# Patient Record
Sex: Male | Born: 1980 | Race: White | Hispanic: No | Marital: Single | Smoking: Current some day smoker
Health system: Southern US, Community
[De-identification: ages and names within clinical notes are randomized; demographics above are authoritative.]

## PROBLEM LIST (undated history)

## (undated) DIAGNOSIS — Z765 Malingerer [conscious simulation]: Secondary | ICD-10-CM

## (undated) DIAGNOSIS — F419 Anxiety disorder, unspecified: Secondary | ICD-10-CM

## (undated) DIAGNOSIS — F1010 Alcohol abuse, uncomplicated: Secondary | ICD-10-CM

## (undated) DIAGNOSIS — G8929 Other chronic pain: Secondary | ICD-10-CM

---

## 2019-06-24 ENCOUNTER — Encounter (HOSPITAL_BASED_OUTPATIENT_CLINIC_OR_DEPARTMENT_OTHER): Payer: Self-pay

## 2019-06-24 ENCOUNTER — Emergency Department (HOSPITAL_BASED_OUTPATIENT_CLINIC_OR_DEPARTMENT_OTHER): Payer: Self-pay

## 2019-06-24 ENCOUNTER — Inpatient Hospital Stay (HOSPITAL_BASED_OUTPATIENT_CLINIC_OR_DEPARTMENT_OTHER)
Admission: EM | Admit: 2019-06-24 | Discharge: 2019-06-26 | DRG: 897 | Disposition: A | Discharge status: Home / Self Care | Payer: Self-pay | Attending: Internal Medicine | Admitting: Internal Medicine

## 2019-06-24 ENCOUNTER — Other Ambulatory Visit: Payer: Self-pay

## 2019-06-24 DIAGNOSIS — Z6834 Body mass index (BMI) 34.0-34.9, adult: Secondary | ICD-10-CM

## 2019-06-24 DIAGNOSIS — R000 Tachycardia, unspecified: Secondary | ICD-10-CM

## 2019-06-24 DIAGNOSIS — E669 Obesity, unspecified: Secondary | ICD-10-CM | POA: Diagnosis present

## 2019-06-24 DIAGNOSIS — Z811 Family history of alcohol abuse and dependence: Secondary | ICD-10-CM

## 2019-06-24 DIAGNOSIS — R739 Hyperglycemia, unspecified: Secondary | ICD-10-CM | POA: Diagnosis present

## 2019-06-24 DIAGNOSIS — Z20828 Contact with and (suspected) exposure to other viral communicable diseases: Secondary | ICD-10-CM | POA: Diagnosis present

## 2019-06-24 DIAGNOSIS — D72829 Elevated white blood cell count, unspecified: Secondary | ICD-10-CM | POA: Diagnosis present

## 2019-06-24 DIAGNOSIS — F10939 Alcohol use, unspecified with withdrawal, unspecified: Secondary | ICD-10-CM | POA: Diagnosis present

## 2019-06-24 DIAGNOSIS — Y908 Blood alcohol level of 240 mg/100 ml or more: Secondary | ICD-10-CM | POA: Diagnosis present

## 2019-06-24 DIAGNOSIS — F10239 Alcohol dependence with withdrawal, unspecified: Secondary | ICD-10-CM | POA: Diagnosis present

## 2019-06-24 DIAGNOSIS — F10932 Alcohol use, unspecified with withdrawal with perceptual disturbance: Secondary | ICD-10-CM

## 2019-06-24 DIAGNOSIS — F10232 Alcohol dependence with withdrawal with perceptual disturbance: Principal | ICD-10-CM | POA: Diagnosis present

## 2019-06-24 DIAGNOSIS — K709 Alcoholic liver disease, unspecified: Secondary | ICD-10-CM | POA: Diagnosis present

## 2019-06-24 LAB — COMPREHENSIVE METABOLIC PANEL
ALT: 43 U/L (ref 0–44)
AST: 37 U/L (ref 15–41)
Albumin: 4 g/dL (ref 3.5–5.0)
Alkaline Phosphatase: 58 U/L (ref 38–126)
Anion gap: 13 (ref 5–15)
BUN: 11 mg/dL (ref 6–20)
CO2: 25 mmol/L (ref 22–32)
Calcium: 8.4 mg/dL — ABNORMAL LOW (ref 8.9–10.3)
Chloride: 102 mmol/L (ref 98–111)
Creatinine, Ser: 0.91 mg/dL (ref 0.61–1.24)
GFR calc Af Amer: >60 mL/min (ref >60)
GFR calc non Af Amer: >60 mL/min (ref >60)
Glucose, Bld: 150 mg/dL — ABNORMAL HIGH (ref 70–99)
Potassium: 3.9 mmol/L (ref 3.5–5.1)
Sodium: 140 mmol/L (ref 135–145)
Total Bilirubin: 1.5 mg/dL — ABNORMAL HIGH (ref 0.3–1.2)
Total Protein: 7.1 g/dL (ref 6.5–8.1)

## 2019-06-24 LAB — CBC
HCT: 44.5 % (ref 39.0–52.0)
Hemoglobin: 15.5 g/dL (ref 13.0–17.0)
MCH: 30.3 pg (ref 26.0–34.0)
MCHC: 34.8 g/dL (ref 30.0–36.0)
MCV: 86.9 fL (ref 80.0–100.0)
Platelets: 324 10*3/uL (ref 150–400)
RBC: 5.12 MIL/uL (ref 4.22–5.81)
RDW: 11.9 % (ref 11.5–15.5)
WBC: 11.2 10*3/uL — ABNORMAL HIGH (ref 4.0–10.5)
nRBC: 0 % (ref 0.0–0.2)

## 2019-06-24 LAB — SARS CORONAVIRUS 2 AG (30 MIN TAT): SARS Coronavirus 2 Ag: NEGATIVE

## 2019-06-24 LAB — ETHANOL: Alcohol, Ethyl (B): 311 mg/dL (ref <10)

## 2019-06-24 MED ORDER — LORAZEPAM 1 MG PO TABS: 0.0000 mg | ORAL_TABLET | Freq: Two times a day (BID) | ORAL | Status: DC

## 2019-06-24 MED ORDER — THIAMINE HCL 100 MG/ML IJ SOLN
100.0000 mg | Freq: Every day | INTRAMUSCULAR | Status: DC
  Administered 2019-06-24: 15:00:00 100 mg via INTRAVENOUS
  Filled 2019-06-24: qty 2

## 2019-06-24 MED ORDER — LORAZEPAM 2 MG/ML IJ SOLN: 0.0000 mg | Freq: Two times a day (BID) | INTRAMUSCULAR | Status: DC

## 2019-06-24 MED ORDER — LORAZEPAM 2 MG/ML IJ SOLN
2.0000 mg | Freq: Once | INTRAMUSCULAR | Status: AC
  Administered 2019-06-24: 18:00:00 2 mg via INTRAVENOUS
  Filled 2019-06-24: qty 1

## 2019-06-24 MED ORDER — SODIUM CHLORIDE 0.9 % IV BOLUS
1000.0000 mL | Freq: Once | INTRAVENOUS | Status: AC
  Administered 2019-06-24: 1000 mL via INTRAVENOUS

## 2019-06-24 MED ORDER — THIAMINE HCL 100 MG PO TABS
100.0000 mg | ORAL_TABLET | Freq: Every day | ORAL | Status: DC
  Administered 2019-06-25 – 2019-06-26 (×2): 100 mg via ORAL
  Filled 2019-06-24 (×2): qty 1

## 2019-06-24 MED ORDER — LORAZEPAM 2 MG/ML IJ SOLN
0.0000 mg | Freq: Four times a day (QID) | INTRAMUSCULAR | Status: DC
  Administered 2019-06-24 – 2019-06-25 (×3): 1 mg via INTRAVENOUS
  Filled 2019-06-24 (×3): qty 1

## 2019-06-24 MED ORDER — LORAZEPAM 1 MG PO TABS
0.0000 mg | ORAL_TABLET | Freq: Four times a day (QID) | ORAL | Status: DC
  Administered 2019-06-25: 1 mg via ORAL
  Filled 2019-06-24: qty 1

## 2019-06-24 MED ORDER — ONDANSETRON HCL 4 MG/2ML IJ SOLN
4.0000 mg | Freq: Once | INTRAMUSCULAR | Status: AC
  Administered 2019-06-24: 4 mg via INTRAVENOUS
  Filled 2019-06-24: qty 2

## 2019-06-24 NOTE — ED Notes (Signed)
Patient stated that he drinks to prevent shakes.  He came here to help him quit.

## 2019-06-24 NOTE — ED Notes (Signed)
ED Provider at bedside. 

## 2019-06-24 NOTE — ED Triage Notes (Signed)
Pt arrives to ED stating that he is trying to detox from ETOH. Last drink "a couple of hours ago". Pt states that he has been drinking for a couple days.

## 2019-06-24 NOTE — ED Provider Notes (Addendum)
[facility] Emergency Department Provider Note MRN:  [id number]  Arrival date & time: 06/24/19     Chief Complaint   Alcohol Problem   History of Present Illness   Leonard Atkinson is a 38 y.o. year-old male with no pertinent past medical history presenting to the ED with chief complaint of alcohol problem.  Patient has been having issues with alcohol for the past 2 years.  He does not drink every day but he tends to go on "benders".  He will binge drink for several days at a time.  He has been drinking heavily for the past 2 days.  He drank this morning to prevent the shakes.  Currently feeling generally unwell but denies pain, denies any trauma.  Endorsing nausea but no vomiting.  No diarrhea.  No fever, no cough.  No chest pain or shortness of breath.  Explains that he wants help quitting because his drinking has been "ruining his life".  Denies any significant depression, no suicidal ideation.  Review of Systems  A complete 10 system review of systems was obtained and all systems are negative except as noted in the HPI and PMH.   Patient's Health History   History reviewed. No pertinent past medical history.  History reviewed. No pertinent surgical history.  No family history on file.  Social History   Socioeconomic History  . Marital status: Single    Spouse name: Not on file  . Number of children: Not on file  . Years of education: Not on file  . Highest education level: Not on file  Occupational History  . Not on file  Tobacco Use  . Smoking status: Never Smoker  . Smokeless tobacco: Never Used  Substance and Sexual Activity  . Alcohol use: Yes    Comment: pt rpeorts 24 beers in the last 24 hours   . Drug use: Never  . Sexual activity: Never  Other Topics Concern  . Not on file  Social History Narrative  . Not on file   Social Determinants of Health   Financial Resource Strain:   . Difficulty of Paying Living Expenses: Not on file    Food Insecurity:   . Worried About Charity fundraiser in the Last Year: Not on file  . Ran Out of Food in the Last Year: Not on file  Transportation Needs:   . Lack of Transportation (Medical): Not on file  . Lack of Transportation (Non-Medical): Not on file  Physical Activity:   . Days of Exercise per Week: Not on file  . Minutes of Exercise per Session: Not on file  Stress:   . Feeling of Stress : Not on file  Social Connections:   . Frequency of Communication with Friends and Family: Not on file  . Frequency of Social Gatherings with Friends and Family: Not on file  . Attends Religious Services: Not on file  . Active Member of Clubs or Organizations: Not on file  . Attends Archivist Meetings: Not on file  . Marital Status: Not on file  Intimate Partner Violence:   . Fear of Current or Ex-Partner: Not on file  . Emotionally Abused: Not on file  . Physically Abused: Not on file  . Sexually Abused: Not on file     Physical Exam  Vital Signs and Nursing Notes reviewed Vitals:   06/24/19 1156 06/24/19 1445  BP: (!) 132/99 (!) 125/105  Pulse: (!) 114 92  Resp: 20   Temp: 98.6  F (37 C)   SpO2: 97%     CONSTITUTIONAL: Well-appearing, NAD NEURO:  Alert and oriented x 3, slow to answer questions, no focal neurological deficits EYES:  eyes equal and reactive ENT/NECK:  no LAD, no JVD CARDIO: Regular rate, well-perfused, normal S1 and S2 PULM:  CTAB no wheezing or rhonchi GI/GU:  normal bowel sounds, non-distended, non-tender MSK/SPINE:  No gross deformities, no edema SKIN:  no rash, atraumatic PSYCH:  Appropriate speech and behavior  Diagnostic and Interventional Summary    EKG Interpretation  Date/Time:    Ventricular Rate:    PR Interval:    QRS Duration:   QT Interval:    QTC Calculation:   R Axis:     Text Interpretation:        Labs Reviewed  CBC - Abnormal; Notable for the following components:      Result Value   WBC 11.2 (*)    All  other components within normal limits  COMPREHENSIVE METABOLIC PANEL - Abnormal; Notable for the following components:   Glucose, Bld 150 (*)    Calcium 8.4 (*)    Total Bilirubin 1.5 (*)    All other components within normal limits  ETHANOL - Abnormal; Notable for the following components:   Alcohol, Ethyl (B) 311 (*)    All other components within normal limits  SARS CORONAVIRUS 2 (TAT 6-24 HRS)    CT Head Wo Contrast  Final Result      Medications  LORazepam (ATIVAN) injection 0-4 mg (1 mg Intravenous Given 06/24/19 1454)    Or  LORazepam (ATIVAN) tablet 0-4 mg ( Oral See Alternative 06/24/19 1454)  LORazepam (ATIVAN) injection 0-4 mg (has no administration in time range)    Or  LORazepam (ATIVAN) tablet 0-4 mg (has no administration in time range)  thiamine tablet 100 mg ( Oral See Alternative 06/24/19 1452)    Or  thiamine (B-1) injection 100 mg (100 mg Intravenous Given 06/24/19 1452)  sodium chloride 0.9 % bolus 1,000 mL (0 mLs Intravenous Stopped 06/24/19 1356)  ondansetron (ZOFRAN) injection 4 mg (4 mg Intravenous Given 06/24/19 1224)     Procedures  /  Critical Care .Critical Care Performed by: Sabas SousBero, Maraya Gwilliam M, MD Authorized by: Sabas SousBero, Asyah Candler M, MD   Critical care provider statement:    Critical care time (minutes):  33   Critical care was necessary to treat or prevent imminent or life-threatening deterioration of the following conditions:  CNS failure or compromise (Delirium, visual hallucinations)   Critical care was time spent personally by me on the following activities:  Discussions with consultants, evaluation of patient's response to treatment, examination of patient, ordering and performing treatments and interventions, ordering and review of laboratory studies, ordering and review of radiographic studies, pulse oximetry, re-evaluation of patient's condition, obtaining history from patient or surrogate and review of old charts    ED Course and Medical  Decision Making  I have reviewed the triage vital signs and the nursing notes.  Pertinent labs & imaging results that were available during my care of the patient were reviewed by me and considered in my medical decision making (see below for details).     Suspect that patient is acutely intoxicated with alcohol, but struggles with withdrawal symptoms when he is not drinking.  Mildly tachycardic but otherwise reassuring vital signs, generally well-appearing, benign abdomen, no evidence of trauma.  Will provide fluids, symptomatic management, check labs to exclude metabolic derangement, reassess.  May be a candidate for discharge  on Librium.  2 PM update: I was notified that patient is now hallucinating.  He endorses visual hallucinations of bugs crawling on the ceiling.  He does not appear particularly tremulous but is mildly tachycardic.  Question of acute alcohol withdrawal with perceptual disturbance versus substance-induced psychosis.  CIWA protocol ordered, will admit to medicine.  Elmer Sow. Pilar Plate, MD [facility] Emergency Medicine [facility] [email or web address]  Final Clinical Impressions(s) / ED Diagnoses     ICD-10-CM   1. Alcohol withdrawal syndrome with perceptual disturbance [facility])  O96.295     ED Discharge Orders    None       Discharge Instructions Discussed with and Provided to Patient:   Discharge Instructions   None       Sabas Sous, MD 06/24/19 1506    Sabas Sous, MD 06/24/19 [phone]

## 2019-06-24 NOTE — ED Provider Notes (Signed)
Pt reassessed. Feels he is having more withdrawal sx.   Repeat ativan ordered.   Dorie Rank, MD 06/24/19 1750

## 2019-06-24 NOTE — ED Notes (Signed)
Ativan given as ordered. Pt verbalized that he is hallucinating again.  He can.see bugs crawling in the ceiling, in the wall.

## 2019-06-25 DIAGNOSIS — F10232 Alcohol dependence with withdrawal with perceptual disturbance: Principal | ICD-10-CM

## 2019-06-25 DIAGNOSIS — R000 Tachycardia, unspecified: Secondary | ICD-10-CM

## 2019-06-25 DIAGNOSIS — D72829 Elevated white blood cell count, unspecified: Secondary | ICD-10-CM

## 2019-06-25 DIAGNOSIS — E669 Obesity, unspecified: Secondary | ICD-10-CM

## 2019-06-25 DIAGNOSIS — F10932 Alcohol use, unspecified with withdrawal with perceptual disturbance: Secondary | ICD-10-CM | POA: Diagnosis present

## 2019-06-25 DIAGNOSIS — R739 Hyperglycemia, unspecified: Secondary | ICD-10-CM

## 2019-06-25 LAB — URINALYSIS, ROUTINE W REFLEX MICROSCOPIC
Bilirubin Urine: NEGATIVE
Glucose, UA: NEGATIVE mg/dL
Hgb urine dipstick: NEGATIVE
Ketones, ur: NEGATIVE mg/dL
Leukocytes,Ua: NEGATIVE
Nitrite: NEGATIVE
Protein, ur: NEGATIVE mg/dL
Specific Gravity, Urine: 1.024 (ref 1.005–1.030)
pH: 6 (ref 5.0–8.0)

## 2019-06-25 LAB — CBC WITH DIFFERENTIAL/PLATELET
Abs Immature Granulocytes: 0.04 10*3/uL (ref 0.00–0.07)
Basophils Absolute: 0.2 10*3/uL — ABNORMAL HIGH (ref 0.0–0.1)
Basophils Relative: 1 %
Eosinophils Absolute: 0.1 10*3/uL (ref 0.0–0.5)
Eosinophils Relative: 1 %
HCT: 43 % (ref 39.0–52.0)
Hemoglobin: 14.4 g/dL (ref 13.0–17.0)
Immature Granulocytes: 0 %
Lymphocytes Relative: 19 %
Lymphs Abs: 2.5 10*3/uL (ref 0.7–4.0)
MCH: 30.1 pg (ref 26.0–34.0)
MCHC: 33.5 g/dL (ref 30.0–36.0)
MCV: 89.8 fL (ref 80.0–100.0)
Monocytes Absolute: 1 10*3/uL (ref 0.1–1.0)
Monocytes Relative: 8 %
Neutro Abs: 9.3 10*3/uL — ABNORMAL HIGH (ref 1.7–7.7)
Neutrophils Relative %: 71 %
Platelets: 245 10*3/uL (ref 150–400)
RBC: 4.79 MIL/uL (ref 4.22–5.81)
RDW: 11.6 % (ref 11.5–15.5)
WBC: 13.2 10*3/uL — ABNORMAL HIGH (ref 4.0–10.5)
nRBC: 0 % (ref 0.0–0.2)

## 2019-06-25 LAB — HIV ANTIBODY (ROUTINE TESTING W REFLEX): HIV Screen 4th Generation wRfx: NONREACTIVE

## 2019-06-25 LAB — HEPATIC FUNCTION PANEL
ALT: 81 U/L — ABNORMAL HIGH (ref 0–44)
AST: 88 U/L — ABNORMAL HIGH (ref 15–41)
Albumin: 3.7 g/dL (ref 3.5–5.0)
Alkaline Phosphatase: 54 U/L (ref 38–126)
Bilirubin, Direct: 0.2 mg/dL (ref 0.0–0.2)
Indirect Bilirubin: 1.9 mg/dL — ABNORMAL HIGH (ref 0.3–0.9)
Total Bilirubin: 2.1 mg/dL — ABNORMAL HIGH (ref 0.3–1.2)
Total Protein: 6.3 g/dL — ABNORMAL LOW (ref 6.5–8.1)

## 2019-06-25 LAB — BASIC METABOLIC PANEL
Anion gap: 9 (ref 5–15)
BUN: 17 mg/dL (ref 6–20)
CO2: 24 mmol/L (ref 22–32)
Calcium: 8.2 mg/dL — ABNORMAL LOW (ref 8.9–10.3)
Chloride: 104 mmol/L (ref 98–111)
Creatinine, Ser: 0.97 mg/dL (ref 0.61–1.24)
GFR calc Af Amer: >60 mL/min (ref >60)
GFR calc non Af Amer: >60 mL/min (ref >60)
Glucose, Bld: 117 mg/dL — ABNORMAL HIGH (ref 70–99)
Potassium: 4.1 mmol/L (ref 3.5–5.1)
Sodium: 137 mmol/L (ref 135–145)

## 2019-06-25 LAB — RAPID URINE DRUG SCREEN, HOSP PERFORMED
Amphetamines: NOT DETECTED
Barbiturates: NOT DETECTED
Benzodiazepines: POSITIVE — AB
Cocaine: NOT DETECTED
Opiates: NOT DETECTED
Tetrahydrocannabinol: NOT DETECTED

## 2019-06-25 LAB — SARS CORONAVIRUS 2 (TAT 6-24 HRS): SARS Coronavirus 2: NEGATIVE

## 2019-06-25 LAB — ETHANOL: Alcohol, Ethyl (B): <10 mg/dL (ref <10)

## 2019-06-25 LAB — TSH: TSH: 2.175 u[IU]/mL (ref 0.350–4.500)

## 2019-06-25 LAB — PROTIME-INR
INR: 1.2 (ref 0.8–1.2)
Prothrombin Time: 15.5 seconds — ABNORMAL HIGH (ref 11.4–15.2)

## 2019-06-25 MED ORDER — SODIUM CHLORIDE 0.9 % IV SOLN: INTRAVENOUS | Status: AC

## 2019-06-25 MED ORDER — POLYETHYLENE GLYCOL 3350 17 G PO PACK: 17.0000 g | PACK | Freq: Every day | ORAL | Status: DC | PRN

## 2019-06-25 MED ORDER — LORAZEPAM 2 MG/ML IJ SOLN
0.0000 mg | Freq: Four times a day (QID) | INTRAMUSCULAR | Status: DC
  Administered 2019-06-25: 11:00:00 2 mg via INTRAVENOUS
  Administered 2019-06-25: 1 mg via INTRAVENOUS
  Administered 2019-06-25 – 2019-06-26 (×2): 2 mg via INTRAVENOUS
  Filled 2019-06-25 (×5): qty 1

## 2019-06-25 MED ORDER — ONDANSETRON HCL 4 MG PO TABS: 4.0000 mg | ORAL_TABLET | Freq: Four times a day (QID) | ORAL | Status: DC | PRN

## 2019-06-25 MED ORDER — LORAZEPAM 2 MG/ML IJ SOLN: 0.0000 mg | Freq: Two times a day (BID) | INTRAMUSCULAR | Status: DC

## 2019-06-25 MED ORDER — ENOXAPARIN SODIUM 40 MG/0.4ML ~~LOC~~ SOLN
SUBCUTANEOUS | Status: AC
  Filled 2019-06-25: qty 0.4

## 2019-06-25 MED ORDER — ACETAMINOPHEN 325 MG PO TABS: 650.0000 mg | ORAL_TABLET | Freq: Four times a day (QID) | ORAL | Status: DC | PRN

## 2019-06-25 MED ORDER — ADULT MULTIVITAMIN W/MINERALS CH
1.0000 | ORAL_TABLET | Freq: Every day | ORAL | Status: DC
  Administered 2019-06-25 – 2019-06-26 (×2): 1 via ORAL
  Filled 2019-06-25 (×2): qty 1

## 2019-06-25 MED ORDER — LORAZEPAM 1 MG PO TABS: 1.0000 mg | ORAL_TABLET | ORAL | Status: DC | PRN

## 2019-06-25 MED ORDER — ENOXAPARIN SODIUM 60 MG/0.6ML ~~LOC~~ SOLN
50.0000 mg | SUBCUTANEOUS | Status: DC
  Administered 2019-06-25 – 2019-06-26 (×2): 50 mg via SUBCUTANEOUS
  Filled 2019-06-25 (×2): qty 0.6

## 2019-06-25 MED ORDER — ACETAMINOPHEN 650 MG RE SUPP: 650.0000 mg | Freq: Four times a day (QID) | RECTAL | Status: DC | PRN

## 2019-06-25 MED ORDER — ENSURE ENLIVE PO LIQD
237.0000 mL | Freq: Two times a day (BID) | ORAL | Status: DC
  Administered 2019-06-26: 10:00:00 237 mL via ORAL

## 2019-06-25 MED ORDER — FOLIC ACID 1 MG PO TABS
1.0000 mg | ORAL_TABLET | Freq: Every day | ORAL | Status: DC
  Administered 2019-06-25 – 2019-06-26 (×2): 1 mg via ORAL
  Filled 2019-06-25 (×2): qty 1

## 2019-06-25 MED ORDER — FAMOTIDINE 20 MG PO TABS
20.0000 mg | ORAL_TABLET | Freq: Two times a day (BID) | ORAL | Status: DC
  Administered 2019-06-25 – 2019-06-26 (×3): 20 mg via ORAL
  Filled 2019-06-25 (×3): qty 1

## 2019-06-25 MED ORDER — ENOXAPARIN SODIUM 40 MG/0.4ML ~~LOC~~ SOLN
40.0000 mg | Freq: Once | SUBCUTANEOUS | Status: AC
  Administered 2019-06-25: 40 mg via SUBCUTANEOUS

## 2019-06-25 MED ORDER — LORAZEPAM 2 MG/ML IJ SOLN
1.0000 mg | INTRAMUSCULAR | Status: DC | PRN
  Administered 2019-06-25 – 2019-06-26 (×3): 2 mg via INTRAVENOUS
  Filled 2019-06-25 (×3): qty 1

## 2019-06-25 MED ORDER — ONDANSETRON HCL 4 MG/2ML IJ SOLN: 4.0000 mg | Freq: Four times a day (QID) | INTRAMUSCULAR | Status: DC | PRN

## 2019-06-25 NOTE — ED Notes (Signed)
Pt ambulated to restroom  Pt stayed in restroom for some time  Pt ambulated back to room  Assisted pt back to bed  Pt's jeans wet, pt states he does not want to change them right now, pulled covers up over himself

## 2019-06-25 NOTE — ED Notes (Signed)
Called and spoke with Jackelyn Poling, Therapist, sports at [facility] and gave update

## 2019-06-25 NOTE — ED Notes (Signed)
Pt given graham crackers and water.

## 2019-06-25 NOTE — Progress Notes (Signed)
Initial Nutrition Assessment  DOCUMENTATION CODES:   Obesity unspecified  INTERVENTION:  -Ensure Enlive po BID, each supplement provides 350 kcal and 20 grams of protein (vanilla)  -Continue MVI with minerals daily  -Encourage po intake  NUTRITION DIAGNOSIS:   Inadequate oral intake related to social / environmental circumstances as evidenced by energy intake < or equal to 50% for > or equal to 5 days, per patient/family report.  GOAL:   Patient will meet greater than or equal to 90% of their needs  MONITOR:   PO intake, Supplement acceptance, Labs, Weight trends, I & O's  REASON FOR ASSESSMENT:   Consult Assessment of nutrition requirement/status  ASSESSMENT:  38 year old male with past medical history of alcoholism who presented to ED after binge drinking for several days, complains of hallucinations and having shakes. Patient reports last drink prior to presentation was the morning of 12/17; ethyl alcohol was 311 in ED and admitted with alcohol withdrawal syndrome with perceptual disturbance.  Per chart, patient recently located to [facility] from [facility]. Patient reported that he had drank for years and has been sober approximately a year prior to relapse. Met with patient at bedside this afternoon, untouched broth on bedside tray. Patient reports that he has had some water, but was not feeling hungry at time of visit. RD encouraged small frequent meals and recommended nutrition supplement to aid with calorie/protein needs. Patient amenable to vanilla Ensure twice daily.  Patient reports usual intake of a healthy diet eating 3 meals/day. Recalls eggs with bacon or ham for breakfast, eating a light lunch and includes chx/tuna salad sandwiches, and meat or salmon with vegetables for dinner. Patient endorses very little po intake over the past few days secondary to drinking beer for several days straight.   Current wt 99.8 kg (219.6 lbs)  Medications reviewed and include:  Pepcid, Folic acid, Ativan, MVI with minerals, Thiamine  Labs: Ca 8.2 (L), Albumin 3.7 (WNL), AST 88 (H), ALT 81 (H), WBC 13.2 (H)  NUTRITION - FOCUSED PHYSICAL EXAM:    Most Recent Value  Orbital Region  Mild depletion  Upper Arm Region  Unable to assess  Thoracic and Lumbar Region  Unable to assess  Buccal Region  No depletion  Temple Region  No depletion  Clavicle Bone Region  No depletion  Clavicle and Acromion Bone Region  Unable to assess  Scapular Bone Region  Unable to assess  Dorsal Hand  No depletion  Patellar Region  Unable to assess  Anterior Thigh Region  Unable to assess  Posterior Calf Region  Unable to assess  Edema (RD Assessment)  None  Hair  Reviewed  Eyes  Reviewed  Mouth  Reviewed  Skin  Reviewed  Nails  Reviewed       Diet Order:   Diet Order            Diet regular Room service appropriate? Yes; Fluid consistency: Thin  Diet effective now              EDUCATION NEEDS:   No education needs have been identified at this time  Skin:  Skin Assessment: Reviewed RN Assessment  Last BM:  12/18  Height:   Ht Readings from Last 1 Encounters:  06/24/19 5' 9" (1.753 m)    Weight:   Wt Readings from Last 1 Encounters:  06/24/19 99.8 kg    Ideal Body Weight:  72.7 kg  BMI:  Body mass index is 32.49 kg/m.  Estimated Nutritional Needs:  Kcal:  2100-2280  Protein:  105-114  Fluid:  >/= 2.1 L/day   Lajuan Lines, RD, LDN Clinical Nutrition Office [phone] After Hours/Weekend Pager: [phone]

## 2019-06-25 NOTE — Progress Notes (Signed)
I called for an update re: pt transfer- still waiting on [facility] per Lisa,RN

## 2019-06-25 NOTE — ED Notes (Signed)
ED TO INPATIENT HANDOFF REPORT  ED Nurse Name and Phone #: Lattie Haw [phone]  S Name/Age/Gender Leonard Atkinson 38 y.o. male Room/Bed: MH04/MH04  Code Status   Code Status: Not on file  Home/SNF/Other Home Patient oriented to: self, place, time and situation Is this baseline? Yes   Triage Complete: Triage complete  Chief Complaint Alcohol withdrawal [facility]) [F10.239]  Triage Note Pt arrives to ED stating that he is trying to detox from ETOH. Last drink "a couple of hours ago". Pt states that he has been drinking for a couple days.     Allergies No Known Allergies  Level of Care/Admitting Diagnosis ED Disposition    ED Disposition Condition [facility] Area: [place] [[id number]]  Level of Care: Telemetry [5]  Admit to tele based on following criteria: Other see comments  Comments: monitoring for alcohol withdrawal, visual hallucinations  Covid Evaluation: Asymptomatic Screening Protocol (No Symptoms)  Diagnosis: Alcohol withdrawal ([place]) [291.81.ICD-9-CM]  Admitting Physician: Lucky Cowboy [[id number]]  Attending Physician: Lucky Cowboy [[id number]]  Estimated length of stay: 3 - 4 days  Certification:: I certify this patient will need inpatient services for at least 2 midnights       B Medical/Surgery History History reviewed. No pertinent past medical history. History reviewed. No pertinent surgical history.   A IV Location/Drains/Wounds Patient Lines/Drains/Airways Status   Active Line/Drains/Airways    Name:   Placement date:   Placement time:   Site:   Days:   Peripheral IV 06/24/19 Right Antecubital   06/24/19    1215    Antecubital   1          Intake/Output Last 24 hours No intake or output data in the 24 hours ending 06/25/19 0555  Labs/Imaging Results for orders placed or performed during the hospital encounter of 06/24/19 (from the past 48 hour(s))  CBC     Status: Abnormal   Collection Time: 06/24/19 12:10 PM   Result Value Ref Range   WBC 11.2 (H) 4.0 - 10.5 K/uL   RBC 5.12 4.22 - 5.81 MIL/uL   Hemoglobin 15.5 13.0 - 17.0 g/dL   HCT 44.5 39.0 - 52.0 %   MCV 86.9 80.0 - 100.0 fL   MCH 30.3 26.0 - 34.0 pg   MCHC 34.8 30.0 - 36.0 g/dL   RDW 11.9 11.5 - 15.5 %   Platelets 324 150 - 400 K/uL   nRBC 0.0 0.0 - 0.2 %    Comment: Performed at [facility], [address] [place], [place] [address]  CMP     Status: Abnormal   Collection Time: 06/24/19 12:10 PM  Result Value Ref Range   Sodium 140 135 - 145 mmol/L   Potassium 3.9 3.5 - 5.1 mmol/L   Chloride 102 98 - 111 mmol/L   CO2 25 22 - 32 mmol/L   Glucose, Bld 150 (H) 70 - 99 mg/dL   BUN 11 6 - 20 mg/dL   Creatinine, Ser 0.91 0.61 - 1.24 mg/dL   Calcium 8.4 (L) 8.9 - 10.3 mg/dL   Total Protein 7.1 6.5 - 8.1 g/dL   Albumin 4.0 3.5 - 5.0 g/dL   AST 37 15 - 41 U/L   ALT 43 0 - 44 U/L   Alkaline Phosphatase 58 38 - 126 U/L   Total Bilirubin 1.5 (H) 0.3 - 1.2 mg/dL   GFR calc non Af Amer >60 >60 mL/min   GFR calc Af Amer >60 >  60 mL/min   Anion gap 13 5 - 15    Comment: Performed at [facility], [address] [place], [place] [address]  Ethanol     Status: Abnormal   Collection Time: 06/24/19 12:10 PM  Result Value Ref Range   Alcohol, Ethyl (B) 311 (HH) <10 mg/dL    Comment: CRITICAL RESULT CALLED TO, READ BACK BY AND VERIFIED WITH: CALLED TO M.SIMMS RN AT 1300 BY SROY ON [id number] (NOTE) Lowest detectable limit for serum alcohol is 10 mg/dL. For medical purposes only. Performed at [facility], [address] [place], [place] [address]   SARS Coronavirus 2 Ag (30 min TAT) - Nasal Swab (BD Veritor Kit)     Status: None   Collection Time: 06/24/19  2:59 PM   Specimen: Nasal Swab (BD Veritor Kit)  Result Value Ref Range   SARS Coronavirus 2 Ag NEGATIVE NEGATIVE    Comment: (NOTE) SARS-CoV-2 antigen NOT DETECTED.  Negative results are presumptive.  Negative results do not preclude SARS-CoV-2  infection and should not be used as the sole basis for treatment or other patient management decisions, including infection  control decisions, particularly in the presence of clinical signs and  symptoms consistent with COVID-19, or in those who have been in contact with the virus.  Negative results must be combined with clinical observations, patient history, and epidemiological information. The expected result is Negative. Fact Sheet for Patients: PodPark.tn Fact Sheet for Healthcare Providers: GiftContent.is This test is not yet approved or cleared by the [place] FDA and  has been authorized for detection and/or diagnosis of SARS-CoV-2 by FDA under an Emergency Use Authorization (EUA).  This EUA will remain in effect (meaning this test can be used) for the duration of  the COVID-19 de claration under Section 564(b)(1) of the Act, 21 U.S.C. section 360bbb-3(b)(1), unless the authorization is terminated or revoked sooner. Performed at [facility], [address] [place], [place] [address]    CT Head Wo Contrast  Result Date: 06/24/2019 CLINICAL DATA:  Visual hallucinations. Trying to detox from alcohol. EXAM: CT HEAD WITHOUT CONTRAST TECHNIQUE: Contiguous axial images were obtained from the base of the skull through the vertex without intravenous contrast. COMPARISON:  None. FINDINGS: Brain: No evidence of acute infarction, hemorrhage, hydrocephalus, extra-axial collection or mass lesion/mass effect. Vascular: No hyperdense vessel or unexpected calcification. Skull: Normal. Negative for fracture or focal lesion. Sinuses/Orbits: No acute finding. Other: None. IMPRESSION: 1. Normal noncontrast head CT. Electronically Signed   By: Titus Dubin M.D.   On: 06/24/2019 14:53    Pending Labs Unresulted Labs (From admission, onward)    Start     Ordered   06/24/19 1900  SARS CORONAVIRUS 2 (TAT 6-24 HRS)  Once,    STAT     06/24/19 1900          Vitals/Pain Today's Vitals   06/24/19 2331 06/25/19 0310 06/25/19 0400 06/25/19 0500  BP: 125/89 (!) 140/101 (!) 140/96 (!) 133/98  Pulse: 94 (!) 102 94 96  Resp: 20 20 (!) 21 (!) 21  Temp:  98.4 F (36.9 C)    TempSrc:  Oral    SpO2: 97% 98% 94% 93%  Weight:      Height:      PainSc:        Isolation Precautions Airborne and Contact precautions  Medications Medications  LORazepam (ATIVAN) injection 0-4 mg (1 mg Intravenous Given 06/25/19 0316)  Or  LORazepam (ATIVAN) tablet 0-4 mg ( Oral See Alternative 06/25/19 0316)  LORazepam (ATIVAN) injection 0-4 mg (has no administration in time range)    Or  LORazepam (ATIVAN) tablet 0-4 mg (has no administration in time range)  thiamine tablet 100 mg ( Oral See Alternative 06/24/19 1452)    Or  thiamine (B-1) injection 100 mg (100 mg Intravenous Given 06/24/19 1452)  sodium chloride 0.9 % bolus 1,000 mL (0 mLs Intravenous Stopped 06/24/19 1356)  ondansetron (ZOFRAN) injection 4 mg (4 mg Intravenous Given 06/24/19 1224)  LORazepam (ATIVAN) injection 2 mg (2 mg Intravenous Given 06/24/19 1801)    Mobility walks Moderate fall risk   Focused Assessments psych assessment    R Recommendations: See Admitting Provider Note  Report given to:   Additional Notes: CIWA protocol every 6 hours

## 2019-06-25 NOTE — ED Notes (Signed)
COVID test collected

## 2019-06-25 NOTE — H&P (Signed)
History and Physical    Leonard Atkinson ZDG:[id number] DOB: 10-09-1980 DOA: 06/24/2019  PCP: No primary care provider on file.   Patient coming from: Home  Chief Complaint: Shakes  HPI: Leonard Atkinson is a 38 y.o. male with medical history significant for but not limited too alcoholism who has been drinking alcohol for years and was sober for about a year and recently relapsed after binge drinking for several days.  Of note patient recently also relocated from [facility].  He states that he has no medical issues and states that he has having shakes and her last drink was yesterday morning when he came to the hospital.  States that he started having some hallucinations of different types of hallucinations including bugs in the ceiling, people.  Denies any SI or HI.  States he is little bit nauseous from not eating and states he is hungry.  Denies any chest pain, lightheadedness or dizziness and had a little bit of loose stools.  No other concerns or complaints at this time but generally does not feel well.  No cough or fever denies any chest pain, shortness of breath.  ED Course: In the ED patient was given lorazepam and also given a fluid bolus as well as placed on famotidine.  Basic blood work was obtained and initial SARS Covid testing was negative and repeat still pending.  Head CT was done without contrast and was normal.  Review of Systems: As per HPI otherwise all other systems reviewed and negative.   PAST MEDICAL HISTORY History is reviewed with patient and he states that he has had no past medical history except for ALCOHOLISM  PAST SURGICAL HISTORY  Denies any surgical history denies having any surgeries  SOCIAL HISTORY   reports that he has never smoked. He has never used smokeless tobacco. He reports current alcohol use. He reports that he does not use drugs.  ALLERGIES No Known Allergies  FAMILY HISTORY States both his parents are deceased.  Patient's mom had alcoholism and  patient's father had cancer  Prior to Admission medications   Not on File   Physical Exam: Vitals:   06/25/19 0819 06/25/19 0820 06/25/19 0830 06/25/19 1008  BP: (!) 138/99 (!) 138/99 (!) 139/107 (!) 125/95  Pulse:  (!) 101 94 87  Resp:  _0 Temp:    98.6 F (37 C)  TempSrc:    Oral  SpO2:  95% 96% 97%  Weight:      Height:       Constitutional: WN/WD obese Caucasian male in NAD and appears anxious  Eyes: Lids and conjunctivae normal, sclerae anicteric  ENMT: External Ears, Nose appear normal. Grossly normal hearing.   Neck: Appears normal, supple, no cervical masses, normal ROM, no appreciable thyromegaly; no JVD Respiratory: Diminished to auscultation bilaterally, no wheezing, rales, rhonchi or crackles. Normal respiratory effort and patient is not tachypenic. No accessory muscle use.  Cardiovascular: RRR, no murmurs / rubs / gallops. S1 and S2 auscultated. No extremity edema. 2+ pedal pulses. No carotid bruits.  Abdomen: Soft, non-tender, Distended. Bowel sounds positive x4 and hyperactive.  GU: Deferred. Musculoskeletal: No clubbing / cyanosis of digits/nails. No joint deformity upper and lower extremities.  Skin: No rashes, lesions, ulcers on a limited skin evaluation. No induration; Warm and dry. Has Skin Tattoos on Arms  Neurologic: CN 2-12 grossly intact with no focal deficits. Romberg sign and cerebellar reflexes not assessed.  Psychiatric: Normal judgment and insight. Alert and oriented x 3. Anxious  mood and appropriate affect. Patient is Hallucinating   Labs on Admission: I have personally reviewed following labs and imaging studies  CBC: Recent Labs  Lab 06/24/19 1210  WBC 11.2*  HGB 15.5  HCT 44.5  MCV 86.9  PLT 254   Basic Metabolic Panel: Recent Labs  Lab 06/24/19 1210 06/25/19 0804  NA 140 137  K 3.9 4.1  CL 102 104  CO2 25 24  GLUCOSE 150* 117*  BUN 11 17  CREATININE 0.91 0.97  CALCIUM 8.4* 8.2*   GFR: Estimated Creatinine Clearance:  117.8 mL/min (by C-G formula based on SCr of 0.97 mg/dL). Liver Function Tests: Recent Labs  Lab 06/24/19 1210  AST 37  ALT 43  ALKPHOS 58  BILITOT 1.5*  PROT 7.1  ALBUMIN 4.0   No results for input(s): LIPASE, AMYLASE in the last 168 hours. No results for input(s): AMMONIA in the last 168 hours. Coagulation Profile: Recent Labs  Lab 06/25/19 0804  INR 1.2   Cardiac Enzymes: No results for input(s): CKTOTAL, CKMB, CKMBINDEX, TROPONINI in the last 168 hours. BNP (last 3 results) No results for input(s): PROBNP in the last 8760 hours. HbA1C: No results for input(s): HGBA1C in the last 72 hours. CBG: No results for input(s): GLUCAP in the last 168 hours. Lipid Profile: No results for input(s): CHOL, HDL, LDLCALC, TRIG, CHOLHDL, LDLDIRECT in the last 72 hours. Thyroid Function Tests: No results for input(s): TSH, T4TOTAL, FREET4, T3FREE, THYROIDAB in the last 72 hours. Anemia Panel: No results for input(s): VITAMINB12, FOLATE, FERRITIN, TIBC, IRON, RETICCTPCT in the last 72 hours. Urine analysis: No results found for: COLORURINE, APPEARANCEUR, LABSPEC, [place], GLUCOSEU, HGBUR, BILIRUBINUR, KETONESUR, PROTEINUR, UROBILINOGEN, NITRITE, LEUKOCYTESUR Sepsis Labs: !!!!!!!!!!!!!!!!!!!!!!!!!!!!!!!!!!!!!!!!!!!! _0 (procalcitonin:4,lacticidven:4) ) Recent Results (from the past 240 hour(s))  SARS Coronavirus 2 Ag (30 min TAT) - Nasal Swab (BD Veritor Kit)     Status: None   Collection Time: 06/24/19  2:59 PM   Specimen: Nasal Swab (BD Veritor Kit)  Result Value Ref Range Status   SARS Coronavirus 2 Ag NEGATIVE NEGATIVE Final    Comment: (NOTE) SARS-CoV-2 antigen NOT DETECTED.  Negative results are presumptive.  Negative results do not preclude SARS-CoV-2 infection and should not be used as the sole basis for treatment or other patient management decisions, including infection  control decisions, particularly in the presence of clinical signs and  symptoms consistent with  COVID-19, or in those who have been in contact with the virus.  Negative results must be combined with clinical observations, patient history, and epidemiological information. The expected result is Negative. Fact Sheet for Patients: PodPark.tn Fact Sheet for Healthcare Providers: GiftContent.is This test is not yet approved or cleared by the [place] FDA and  has been authorized for detection and/or diagnosis of SARS-CoV-2 by FDA under an Emergency Use Authorization (EUA).  This EUA will remain in effect (meaning this test can be used) for the duration of  the COVID-19 de claration under Section 564(b)(1) of the Act, 21 U.S.C. section 360bbb-3(b)(1), unless the authorization is terminated or revoked sooner. Performed at [facility], [address] [place], [place] [address]     Radiological Exams on Admission: CT Head Wo Contrast  Result Date: 06/24/2019 CLINICAL DATA:  Visual hallucinations. Trying to detox from alcohol. EXAM: CT HEAD WITHOUT CONTRAST TECHNIQUE: Contiguous axial images were obtained from the base of the skull through the vertex without intravenous contrast. COMPARISON:  None. FINDINGS: Brain: No evidence of acute infarction, hemorrhage, hydrocephalus, extra-axial collection  or mass lesion/mass effect. Vascular: No hyperdense vessel or unexpected calcification. Skull: Normal. Negative for fracture or focal lesion. Sinuses/Orbits: No acute finding. Other: None. IMPRESSION: 1. Normal noncontrast head CT. Electronically Signed   By: Titus Dubin M.D.   On: 06/24/2019 14:53   EKG: EKG done in the emergency room for sinus rhythm rate of 91 and QTc 445.  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)   Hyperbilirubinemia   Obesity (BMI 30-39.9)   Leukocytosis    Sinus tachycardia   Hyperglycemia  EtOH Abuse with Withdrawals and Hallucinations and perceptual disturbances -Admit to Inpatient Telemetry given his persistent Hallucinations with bugs on the ceiling and continued EtOH withdrawal; he arrived to the ED stating that he is trying to detox from alcohol and that his last drink was "a couple hours ago" and that he has been drinking for several days now -Patient's Ethyl Alcohol level was 311 and repeat was <10 -Patient states that he drinks to prevent "shakes" -Given 1 Liter of NS Bolus  -Last Drink was yesterday (Day of Admission)  -Started patient on CIWA with Lorazepam  -EDP added Famotidine BID -C/w Folic Acid 1 mg po Daily, MVI + Minerals 1 tab po Daily, and Thiamine 100 mg po/IV Daily -Given Zofran 4 mg IV yesterday but is not nauseous now so we will start on regular diet -We will also start on gentle IV for hydration with 50 mL/per hour for 1 day -Check UDS as well as a Urinalysis  -Head CT was read as a "Normal noncontrast head CT." -Consult social work to provide resources for alcoholism and alcohol abuse -I spoke with the patient's bedside nurse and she states that his CIWA score is about a 6 currently   Hyperbilirubinemia -In the setting of EtOH use -Patient's T Bili was 1.5 yesterday -Check a Hepatic Fxn Panel today -Continue to Monitor and Trend Bilirubin and repeat CMP in AM   Obesity -Estimated body mass index is 32.49 kg/m as calculated from the following:   Height as of this encounter: 5' 9" (1.753 m).   Weight as of this encounter: 99.8 kg. -Weight Loss and Dietary Counseling given  -Consult Nutritionist for further evaluation and recommendations   Hyperglycemia -Patient's Blood Sugar on Admission was 150; Repeat this AM was 117 -Check HbA1c -Continue to Monitor and Trend Blood Sugars carefully and if necessary will place on Sensitive Novolog SSI   Leukocytosis  -Patient's WBC was 11.2 and repeat this AM is  pending -In the setting of EtOH Withdrawal  -Continue to Monitor for S/Sx of Infection  -Repeat CBC in AM   Sinus Tachycardia, improving  -In the setting of alcohol withdrawal-continue telemetry monitoring and repeat an EKG now -Continue monitor heart rate carefully in the setting of his withdrawals -Check TSH   DVT prophylaxis: Enoxaparin 40 mg sq q24h Code Status: FULL CODE  Family Communication: No family present at bedside  Disposition Plan: Pending Improvement of Withdrawals and Resources given by Education officer, museum Consults called: None Admission status: Inpatient Telemetry   Severity of Illness: The appropriate patient status for this patient is INPATIENT. Inpatient status is judged to be reasonable and necessary in order to provide the required intensity of service to ensure the patient's safety. The patient's presenting symptoms, physical exam findings, and initial radiographic and laboratory data in the context of their chronic comorbidities is felt to  place them at high risk for further clinical deterioration. Furthermore, it is not anticipated that the patient will be medically stable for discharge from the hospital within 2 midnights of admission. The following factors support the patient status of inpatient.   " The patient's presenting symptoms include Visual hallucinations and "shakes". " The worrisome physical exam findings include anxiousness. " The initial radiographic and laboratory data are indicated as abo e " The chronic co-morbidities include alcoholism    * I certify that at the point of admission it is my clinical judgment that the patient will require inpatient hospital care spanning beyond 2 midnights from the point of admission due to high intensity of service, high risk for further deterioration and high frequency of surveillance required.Kerney Elbe, D.O. [facility] is on [place]  If 7PM-7AM, please contact  night-coverage [email or web address]  06/25/2019, 11:00 AM

## 2019-06-25 NOTE — ED Notes (Signed)
Pt up to restroom   Pt c/o hallucinations  Anxious in words and actions  Pt directed back to bed  Explained to pt it was too early for medication at this time

## 2019-06-25 NOTE — ED Provider Notes (Signed)
Patient has been admitted for alcohol withdrawal.  This morning he reports he is still having hallucinations.  He does not specify what they are.  He reports just a lot of random stuff.  He denies he has had any vomiting.  He denies he has vomiting preceding his presentation.  He denies history of GI bleed.  He denies other significant medical history but reports he had some problems with back pain in the past.  He cannot remember when his last drink was.  He estimates about an hour before he presented to the emergency department.  He denies other drugs of abuse. Physical Exam  BP 132/85   Pulse 88   Temp 98.4 F (36.9 C) (Oral)   Resp 17   Ht 5\' 9"  (1.753 m)   Wt 99.8 kg   SpO2 96%   BMI 32.49 kg/m   Physical Exam Constitutional:      Comments: Patient is resting quietly central the room.  He awakens to light voice.  He is alert and oriented.  No respiratory distress.  Vital signs are normal.  HENT:     Head: Atraumatic.  Eyes:     Extraocular Movements: Extraocular movements intact.  Cardiovascular:     Rate and Rhythm: Normal rate and regular rhythm.  Pulmonary:     Effort: Pulmonary effort is normal.     Breath sounds: Normal breath sounds.  Abdominal:     Palpations: Abdomen is soft.     Tenderness: There is no guarding.  Musculoskeletal:        General: No swelling or tenderness.  Skin:    General: Skin is warm and dry.     Coloration: Skin is not jaundiced.  Neurological:     General: No focal deficit present.     Mental Status: He is oriented to person, place, and time.     Coordination: Coordination normal.     Comments: Patient is not actively tremulous with hands extended.     ED Course/Procedures     Procedures  MDM  Admitted patient at 20 hours of ED time.  Awaiting bed placement.  We will continue CIWA scale.  Patient does not appear decompensating.  Continues to report visual hallucinations but does not specify quality.  His interactions with me are alert  and oriented.  Will give normal DVT prophylaxis with Lovenox.  Will add Pepcid twice daily.  Patient advises he will try oral intake.  He has had no vomiting, denies history of ulcer or GI bleed.  Does not complain of abdominal pain.       Charlesetta Shanks, MD 06/25/19 [phone]

## 2019-06-25 NOTE — Progress Notes (Signed)
Pt arrived from [facility] to [place]. VS stable and pt is resting.

## 2019-06-26 ENCOUNTER — Emergency Department (HOSPITAL_BASED_OUTPATIENT_CLINIC_OR_DEPARTMENT_OTHER)
Admission: EM | Admit: 2019-06-26 | Discharge: 2019-06-26 | Disposition: A | Discharge status: Home / Self Care | Payer: Self-pay | Attending: Emergency Medicine | Admitting: Emergency Medicine

## 2019-06-26 ENCOUNTER — Encounter (HOSPITAL_BASED_OUTPATIENT_CLINIC_OR_DEPARTMENT_OTHER): Payer: Self-pay | Admitting: Emergency Medicine

## 2019-06-26 ENCOUNTER — Other Ambulatory Visit: Payer: Self-pay

## 2019-06-26 DIAGNOSIS — R6889 Other general symptoms and signs: Secondary | ICD-10-CM

## 2019-06-26 DIAGNOSIS — Z79899 Other long term (current) drug therapy: Secondary | ICD-10-CM | POA: Insufficient documentation

## 2019-06-26 DIAGNOSIS — F10232 Alcohol dependence with withdrawal with perceptual disturbance: Secondary | ICD-10-CM | POA: Insufficient documentation

## 2019-06-26 DIAGNOSIS — F419 Anxiety disorder, unspecified: Secondary | ICD-10-CM | POA: Insufficient documentation

## 2019-06-26 DIAGNOSIS — R531 Weakness: Secondary | ICD-10-CM | POA: Insufficient documentation

## 2019-06-26 DIAGNOSIS — R251 Tremor, unspecified: Secondary | ICD-10-CM | POA: Insufficient documentation

## 2019-06-26 DIAGNOSIS — R110 Nausea: Secondary | ICD-10-CM | POA: Insufficient documentation

## 2019-06-26 HISTORY — DX: Anxiety disorder, unspecified: F41.9

## 2019-06-26 HISTORY — DX: Alcohol abuse, uncomplicated: F10.10

## 2019-06-26 LAB — COMPREHENSIVE METABOLIC PANEL
ALT: 61 U/L — ABNORMAL HIGH (ref 0–44)
AST: 50 U/L — ABNORMAL HIGH (ref 15–41)
Albumin: 3.6 g/dL (ref 3.5–5.0)
Alkaline Phosphatase: 68 U/L (ref 38–126)
Anion gap: 9 (ref 5–15)
BUN: 20 mg/dL (ref 6–20)
CO2: 24 mmol/L (ref 22–32)
Calcium: 8.4 mg/dL — ABNORMAL LOW (ref 8.9–10.3)
Chloride: 104 mmol/L (ref 98–111)
Creatinine, Ser: 0.98 mg/dL (ref 0.61–1.24)
GFR calc Af Amer: >60 mL/min (ref >60)
GFR calc non Af Amer: >60 mL/min (ref >60)
Glucose, Bld: 106 mg/dL — ABNORMAL HIGH (ref 70–99)
Potassium: 3.7 mmol/L (ref 3.5–5.1)
Sodium: 137 mmol/L (ref 135–145)
Total Bilirubin: 1.7 mg/dL — ABNORMAL HIGH (ref 0.3–1.2)
Total Protein: 6.2 g/dL — ABNORMAL LOW (ref 6.5–8.1)

## 2019-06-26 LAB — CBC
HCT: 39.7 % (ref 39.0–52.0)
Hemoglobin: 13.1 g/dL (ref 13.0–17.0)
MCH: 29.9 pg (ref 26.0–34.0)
MCHC: 33 g/dL (ref 30.0–36.0)
MCV: 90.6 fL (ref 80.0–100.0)
Platelets: 216 10*3/uL (ref 150–400)
RBC: 4.38 MIL/uL (ref 4.22–5.81)
RDW: 11.5 % (ref 11.5–15.5)
WBC: 8.2 10*3/uL (ref 4.0–10.5)
nRBC: 0 % (ref 0.0–0.2)

## 2019-06-26 LAB — HEMOGLOBIN A1C
Hgb A1c MFr Bld: 6 % — ABNORMAL HIGH (ref 4.8–5.6)
Mean Plasma Glucose: 125.5 mg/dL

## 2019-06-26 LAB — GLUCOSE, CAPILLARY: Glucose-Capillary: 87 mg/dL (ref 70–99)

## 2019-06-26 MED ORDER — FOLIC ACID 1 MG PO TABS: 1.0000 mg | ORAL_TABLET | Freq: Every day | ORAL | 1 refills | Status: AC

## 2019-06-26 MED ORDER — CHLORDIAZEPOXIDE HCL 25 MG PO CAPS: ORAL_CAPSULE | ORAL | 0 refills | Status: DC

## 2019-06-26 MED ORDER — THIAMINE HCL 100 MG PO TABS: 100.0000 mg | ORAL_TABLET | Freq: Every day | ORAL | 1 refills | Status: AC

## 2019-06-26 NOTE — ED Notes (Signed)
ED Provider at bedside. Trifan MD 

## 2019-06-26 NOTE — ED Triage Notes (Signed)
Discharged from [facility] today . States feeling anxious. No anxiety medicine. Has not drank since discharge.

## 2019-06-26 NOTE — Discharge Summary (Signed)
Physician Discharge Summary  Leonard Atkinson ZOX:[id number] DOB: 08-Feb-1981 DOA: 06/24/2019  PCP: No primary care provider on file.  Admit date: 06/24/2019 Discharge date: 06/26/2019  Admitted From: Home Disposition:  Home  Discharge Condition:Stable CODE STATUS:FULL Diet recommendation:  Regular    Brief/Interim Summary:  HPI: Leonard Atkinson is a 38 y.o. male with medical history significant for but not limited too alcoholism who has been drinking alcohol for years and was sober for about a year and recently relapsed after binge drinking for several days.  Of note patient recently also relocated from [facility].  He states that he has no medical issues and states that he has having shakes and her last drink was yesterday morning when he came to the hospital.  States that he started having some hallucinations of different types of hallucinations including bugs in the ceiling, people.  Denies any SI or HI.  States he is little bit nauseous from not eating and states he is hungry.  Denies any chest pain, lightheadedness or dizziness and had a little bit of loose stools.  No other concerns or complaints at this time but generally does not feel well.  No cough or fever denies any chest pain, shortness of breath.  ED Course: In the ED patient was given lorazepam and also given a fluid bolus as well as placed on famotidine.  Basic blood work was obtained and initial SARS Covid testing was negative and repeat still pending.  Head CT was done without contrast and was normal.   Hospital Course: His hospital course remained stable.  Currently he is hemodynamically stable.  Not agitated or in withdrawal.  His CIWA score this morning was just 1.  Social worker consulted and he was provided resources for alcohol rehabilitation.  He is hemodynamically stable for discharge to home today.  We recommend to continue thiamine folic acid and quit alcohol.  Following problems were addressed during his  hospitalization:  EtOH Abuse with Withdrawals and Hallucinations and perceptual disturbances Currently hemodynamically stable.  Alert and oriented this morning.  CIWA score of 1 Continue thiamine and folic acid.  Consult alcohol cessation.  Social worker consulted.  Hyperbilirubinemia -Stable, associated with alcoholic liver disease  Hyperglycemia -Stable now. 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 2)Follow up with alcohol rehabilitation services 3)Take prescribed medications as instructed.   Increase activity slowly   Complete by: As directed      Allergies as of 06/26/2019   No Known Allergies     Medication List    TAKE these medications   acetaminophen 500 MG tablet Commonly known as: TYLENOL Take 500 mg by mouth every 6 (six) hours as needed for mild pain or headache.   folic acid 1 MG tablet Commonly known as: FOLVITE Take 1 tablet (1 mg total) by mouth daily. Start taking on: June 27, 2019   thiamine 100 MG tablet Take 1 tablet (100 mg total) by mouth daily. Start taking on: June 27, 2019  No Known Allergies  Consultations:  None   Procedures/Studies: CT Head Wo Contrast  Result Date: 06/24/2019 CLINICAL DATA:  Visual hallucinations. Trying to detox from alcohol. EXAM: CT HEAD WITHOUT CONTRAST TECHNIQUE: Contiguous axial images were obtained from the base of the skull through the vertex without intravenous contrast. COMPARISON:  None. FINDINGS: Brain: No evidence of  acute infarction, hemorrhage, hydrocephalus, extra-axial collection or mass lesion/mass effect. Vascular: No hyperdense vessel or unexpected calcification. Skull: Normal. Negative for fracture or focal lesion. Sinuses/Orbits: No acute finding. Other: None. IMPRESSION: 1. Normal noncontrast head CT. Electronically Signed   By: Titus Dubin M.D.   On: 06/24/2019 14:53       Subjective:  Patient seen and examined at the bedside this morning.  Hemodynamically stable for discharge today.  Discharge Exam: Vitals:   06/25/19 2121 06/26/19 0419  BP: (!) 141/100 (!) 141/97  Pulse: 96 88  Resp: 17   Temp: 98.5 F (36.9 C) 99.2 F (37.3 C)  SpO2: 94% 97%   Vitals:   06/25/19 1459 06/25/19 2121 06/26/19 0419 06/26/19 0500  BP: 139/79 (!) 141/100 (!) 141/97   Pulse: 87 96 88   Resp: 20 17    Temp: 98.4 F (36.9 C) 98.5 F (36.9 C) 99.2 F (37.3 C)   TempSrc: Oral Oral Oral   SpO2: 98% 94% 97%   Weight:    104.9 kg  Height:        General: Pt is alert, awake, not in acute distress Cardiovascular: RRR, S1/S2 +, no rubs, no gallops Respiratory: CTA bilaterally, no wheezing, no rhonchi Abdominal: Soft, NT, ND, bowel sounds + Extremities: no edema, no cyanosis    The results of significant diagnostics from this hospitalization (including imaging, microbiology, ancillary and laboratory) are listed below for reference.     Microbiology: Recent Results (from the past 240 hour(s))  SARS Coronavirus 2 Ag (30 min TAT) - Nasal Swab (BD Veritor Kit)     Status: None   Collection Time: 06/24/19  2:59 PM   Specimen: Nasal Swab (BD Veritor Kit)  Result Value Ref Range Status   SARS Coronavirus 2 Ag NEGATIVE NEGATIVE Final    Comment: (NOTE) SARS-CoV-2 antigen NOT DETECTED.  Negative results are presumptive.  Negative results do not preclude SARS-CoV-2 infection and should not be used as the sole basis for treatment or other patient management decisions, including infection  control  decisions, particularly in the presence of clinical signs and  symptoms consistent with COVID-19, or in those who have been in contact with the virus.  Negative results must be combined with clinical observations, patient history, and epidemiological information. The expected result is Negative. Fact Sheet for Patients: PodPark.tn Fact Sheet for Healthcare Providers: GiftContent.is This test is not yet approved or cleared by the [place] FDA and  has been authorized for detection and/or diagnosis of SARS-CoV-2 by FDA under an Emergency Use Authorization (EUA).  This EUA will remain in effect (meaning this test can be used) for the duration of  the COVID-19 de claration under Section 564(b)(1) of the Act, 21 U.S.C. section 360bbb-3(b)(1), unless the authorization is terminated or revoked sooner. Performed at [facility], [address] [place], [place] [address]   SARS CORONAVIRUS 2 (TAT 6-24 HRS)     Status: None   Collection Time: 06/25/19  8:37 AM  Result Value Ref Range Status   SARS Coronavirus 2 NEGATIVE NEGATIVE Final    Comment: (NOTE) SARS-CoV-2 target nucleic acids are NOT  DETECTED. The SARS-CoV-2 RNA is generally detectable in upper and lower respiratory specimens during the acute phase of infection. Negative results do not preclude SARS-CoV-2 infection, do not rule out co-infections with other pathogens, and should not be used as the sole basis for treatment or other patient management decisions. Negative results must be combined with clinical observations, patient history, and epidemiological information. The expected result is Negative. Fact Sheet for Patients: SugarRoll.be Fact Sheet for Healthcare Providers: https://www.woods-mathews.com/ This test is not yet approved or cleared by the [place] FDA and  has been authorized for detection and/or  diagnosis of SARS-CoV-2 by FDA under an Emergency Use Authorization (EUA). This EUA will remain  in effect (meaning this test can be used) for the duration of the COVID-19 declaration under Section 56 4(b)(1) of the Act, 21 U.S.C. section 360bbb-3(b)(1), unless the authorization is terminated or revoked sooner. Performed at [facility], [place] [address] [place], [place] [address]      Labs: BNP (last 3 results) No results for input(s): BNP in the last 8760 hours. Basic Metabolic Panel: Recent Labs  Lab 06/24/19 1210 06/25/19 0804 06/26/19 0505  NA 140 137 137  K 3.9 4.1 3.7  CL 102 104 104  CO2 '25 24 24  ' GLUCOSE 150* 117* 106*  BUN '11 17 20  ' CREATININE 0.91 0.97 0.98  CALCIUM 8.4* 8.2* 8.4*   Liver Function Tests: Recent Labs  Lab 06/24/19 1210 06/25/19 1043 06/26/19 0505  AST 37 88* 50*  ALT 43 81* 61*  ALKPHOS 58 54 68  BILITOT 1.5* 2.1* 1.7*  PROT 7.1 6.3* 6.2*  ALBUMIN 4.0 3.7 3.6   No results for input(s): LIPASE, AMYLASE in the last 168 hours. No results for input(s): AMMONIA in the last 168 hours. CBC: Recent Labs  Lab 06/24/19 1210 06/25/19 1043 06/26/19 0505  WBC 11.2* 13.2* 8.2  NEUTROABS  --  9.3*  --   HGB 15.5 14.4 13.1  HCT 44.5 43.0 39.7  MCV 86.9 89.8 90.6  PLT 324 245 216   Cardiac Enzymes: No results for input(s): CKTOTAL, CKMB, CKMBINDEX, TROPONINI in the last 168 hours. BNP: Invalid input(s): POCBNP CBG: Recent Labs  Lab 06/26/19 0901  GLUCAP 87   D-Dimer No results for input(s): DDIMER in the last 72 hours. Hgb A1c Recent Labs    06/26/19 0505  HGBA1C 6.0*   Lipid Profile No results for input(s): CHOL, HDL, LDLCALC, TRIG, CHOLHDL, LDLDIRECT in the last 72 hours. Thyroid function studies Recent Labs    06/25/19 1043  TSH 2.175   Anemia work up No results for input(s): VITAMINB12, FOLATE, FERRITIN, TIBC, IRON, RETICCTPCT in the last 72 hours. Urinalysis    Component Value Date/Time   COLORURINE YELLOW  06/25/2019 1110   APPEARANCEUR CLEAR 06/25/2019 1110   LABSPEC 1.024 06/25/2019 1110   PHURINE 6.0 06/25/2019 1110   GLUCOSEU NEGATIVE 06/25/2019 1110   HGBUR NEGATIVE 06/25/2019 1110   [place] 06/25/2019 1110   [place] 06/25/2019 1110   PROTEINUR NEGATIVE 06/25/2019 1110   NITRITE NEGATIVE 06/25/2019 1110   LEUKOCYTESUR NEGATIVE 06/25/2019 1110   Sepsis Labs Invalid input(s): PROCALCITONIN,  WBC,  LACTICIDVEN Microbiology Recent Results (from the past 240 hour(s))  SARS Coronavirus 2 Ag (30 min TAT) - Nasal Swab (BD Veritor Kit)     Status: None   Collection Time: 06/24/19  2:59 PM   Specimen: Nasal Swab (BD Veritor Kit)  Result Value Ref Range Status   SARS Coronavirus 2 Ag NEGATIVE  NEGATIVE Final    Comment: (NOTE) SARS-CoV-2 antigen NOT DETECTED.  Negative results are presumptive.  Negative results do not preclude SARS-CoV-2 infection and should not be used as the sole basis for treatment or other patient management decisions, including infection  control decisions, particularly in the presence of clinical signs and  symptoms consistent with COVID-19, or in those who have been in contact with the virus.  Negative results must be combined with clinical observations, patient history, and epidemiological information. The expected result is Negative. Fact Sheet for Patients: PodPark.tn Fact Sheet for Healthcare Providers: GiftContent.is This test is not yet approved or cleared by the [place] FDA and  has been authorized for detection and/or diagnosis of SARS-CoV-2 by FDA under an Emergency Use Authorization (EUA).  This EUA will remain in effect (meaning this test can be used) for the duration of  the COVID-19 de claration under Section 564(b)(1) of the Act, 21 U.S.C. section 360bbb-3(b)(1), unless the authorization is terminated or revoked sooner. Performed at [facility], [address] [place], [place] [address]   SARS CORONAVIRUS 2 (TAT 6-24 HRS)     Status: None   Collection Time: 06/25/19  8:37 AM  Result Value Ref Range Status   SARS Coronavirus 2 NEGATIVE NEGATIVE Final    Comment: (NOTE) SARS-CoV-2 target nucleic acids are NOT DETECTED. The SARS-CoV-2 RNA is generally detectable in upper and lower respiratory specimens during the acute phase of infection. Negative results do not preclude SARS-CoV-2 infection, do not rule out co-infections with other pathogens, and should not be used as the sole basis for treatment or other patient management decisions. Negative results must be combined with clinical observations, patient history, and epidemiological information. The expected result is Negative. Fact Sheet for Patients: SugarRoll.be Fact Sheet for Healthcare Providers: https://www.woods-mathews.com/ This test is not yet approved or cleared by the [place] FDA and  has been authorized for detection and/or diagnosis of SARS-CoV-2 by FDA under an Emergency Use Authorization (EUA). This EUA will remain  in effect (meaning this test can be used) for the duration of the COVID-19 declaration under Section 56 4(b)(1) of the Act, 21 U.S.C. section 360bbb-3(b)(1), unless the authorization is terminated or revoked sooner. Performed at [facility], [place] [address] [place], [place] [address]     Please note: You were cared for by a hospitalist during your hospital stay. Once you are discharged, your primary care physician will handle any further medical issues. Please note that NO REFILLS for any discharge medications will be authorized once you are discharged, as it is imperative that you return to your primary care physician (or establish a relationship with a primary care physician if you do not have one) for your post hospital discharge needs so that they can reassess your need for medications and  monitor your lab values.    Time coordinating discharge: 40 minutes  SIGNED:   Shelly Coss, MD  [facility] 06/26/2019, 11:59 AM Pager [phone]  If 7PM-7AM, please contact night-coverage [email or web address] Password [email or web address]

## 2019-06-26 NOTE — TOC Initial Note (Addendum)
Transition of Care [facility]) - Initial/Assessment Note    Patient Details  Name: Leonard Atkinson MRN: [id number] Date of Birth: Jan 26, 1981  [facility]) CM/SW Contact:    Trish Mage, LCSW Phone Number: 06/26/2019, 12:07 PM  Clinical Narrative:     Patient seen in response to consult re: SA-alcohol.  Patient admits to recent on-going use, but states he had put together over a year of sobriety time prior to that.  When questioned, revealed that managing anxiety "through a combination of mindfullness techniques and medication" are what he used.  He identified a benzo, specifically Librium as the medication that was effective.  When I pointed out that this is habit forming, controlled and contra-indicated for on-going use to manage anxiety, his response was "so you are setting me up to fail."  I assured him that I would consult with Dr here, and if he is getting a script for benzo., I would set him up with PCP for continued help. Consulted with Dr., who stated he would not be writing a script for same.  Patient given appointment time and date at [facility] and wellness clinic.  TOC sign off.          Expected Discharge Plan: Home/Self Care Barriers to Discharge: No Barriers Identified   Patient Goals and CMS Choice        Expected Discharge Plan and Services Expected Discharge Plan: Home/Self Care         Expected Discharge Date: 06/26/19                                    Prior Living Arrangements/Services                       Activities of Daily Living Home Assistive Devices/Equipment: None ADL Screening (condition at time of admission) Patient's cognitive ability adequate to safely complete daily activities?: Yes Is the patient deaf or have difficulty hearing?: No Does the patient have difficulty seeing, even when wearing glasses/contacts?: No Does the patient have difficulty concentrating, remembering, or making decisions?: No Patient able to  express need for assistance with ADLs?: Yes Does the patient have difficulty dressing or bathing?: No Independently performs ADLs?: Yes (appropriate for developmental age) Does the patient have difficulty walking or climbing stairs?: No Weakness of Legs: None Weakness of Arms/Hands: None  Permission Sought/Granted                  Emotional Assessment              Admission diagnosis:  Alcohol withdrawal ([place]) [F10.239] Alcohol withdrawal syndrome with perceptual disturbance ([place]) [F10.232] Patient Active Problem List   Diagnosis Date Noted  . Alcohol withdrawal syndrome with perceptual disturbance ([place]) 06/25/2019  . Hyperbilirubinemia 06/25/2019  . Obesity (BMI 30-39.9) 06/25/2019  . Leukocytosis 06/25/2019  . Sinus tachycardia 06/25/2019  . Hyperglycemia 06/25/2019  . Alcohol withdrawal ([place]) 06/24/2019   PCP:  No primary care provider on file. Pharmacy:   [id number] [place], [place] [address] [place] [address] Phone: [phone] Fax: [phone]     Social Determinants of Health (SDOH) Interventions    Readmission Risk Interventions No flowsheet data found.

## 2019-06-26 NOTE — ED Provider Notes (Signed)
[facility] EMERGENCY DEPARTMENT Provider Note   CSN: [id number] Arrival date & time: 06/26/19  2108     History Chief Complaint  Patient presents with  . Anxiety    Leonard Atkinson is a 38 y.o. male with a history of chronic alcohol abuse, who was just discharged from [facility] hospital today after stay for acute alcohol withdrawal with visual disturbances (2 days), presenting to the ED complaining of worsening anxiety and feeling onset of withdrawal symptoms.  Patient reports he left the hospital today and return to the hotel room that he is staying at.  Sister the course of the day still likes heart rate has been climbing and he begins to feel again shaky and anxious.  He says these are exactly the current symptoms that are driven him to drink and relapse in the past.  He tells me "I just got out of the hospital for treatment, and I don't want to go down that road again, please help me so I don't start drinking again."  He reports no other immediate stressors in his life.  He denies any other drug use.  He denies suicidal ideation or homicidal ideation.  He reports he feels jittery and nauseous and has diminished appetite.  He believes in the past Librium tapers have significantly helped his symptoms.  He was not prescribed Librium per his last hospital discharge  HPI      Past Medical History:  Diagnosis Date  . Alcohol abuse   . Anxiety     Patient Active Problem List   Diagnosis Date Noted  . 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 Hyperbilirubinemia 06/25/2019  . Obesity (BMI 30-39.9) 06/25/2019  . Leukocytosis 06/25/2019  . Sinus tachycardia 06/25/2019  . Hyperglycemia 06/25/2019  . Alcohol withdrawal (HCC) 06/24/2019    History reviewed. No pertinent surgical history.     No family history on file.  Social History   Tobacco Use  . Smoking status: Never Smoker  . Smokeless tobacco: Never Used  Substance Use  Topics  . Alcohol use: Yes    Comment: pt rpeorts 24 beers in the last 24 hours   . Drug use: Never    Home Medications Prior to Admission medications   Medication Sig Start Date End Date Taking? Authorizing Provider  chlordiazePOXIDE (LIBRIUM) 10 MG capsule Take 10 mg by mouth 3 (three) times daily as needed for anxiety.   Yes [provider]  acetaminophen (TYLENOL) 500 MG tablet Take 500 mg by mouth every 6 (six) hours as needed for mild pain or headache.    [provider]  chlordiazePOXIDE (LIBRIUM) 25 MG capsule 50mg  PO TID x 1D, then 25-50mg  PO BID X 1D, then 25-50mg  PO QD X 1D 06/26/19   Elleigh Cassetta, 06/28/19, MD  folic acid (FOLVITE) 1 MG tablet Take 1 tablet (1 mg total) by mouth daily. 06/27/19   06/29/19, MD  thiamine 100 MG tablet Take 1 tablet (100 mg total) by mouth daily. 06/27/19   06/29/19, MD    Allergies    Patient has no known allergies.  Review of Systems   Review of Systems  Constitutional: Negative for chills and fever.  Eyes: Negative for photophobia and visual disturbance.  Respiratory: Negative for cough and shortness of breath.   Cardiovascular: Negative for chest pain and palpitations.  Gastrointestinal: Positive for nausea. Negative for abdominal pain and vomiting.  Musculoskeletal: Negative for arthralgias and back pain.  Skin: Negative for pallor  and rash.  Neurological: Positive for dizziness, tremors, weakness and light-headedness. Negative for syncope.  Psychiatric/Behavioral: Negative for agitation and confusion.  All other systems reviewed and are negative.   Physical Exam Updated Vital Signs BP 138/84 (BP Location: Right Arm)   Pulse (!) 102   Temp 98.3 F (36.8 C) (Oral)   Resp 20   SpO2 97%   Physical Exam Vitals and nursing note reviewed.  Constitutional:      Appearance: He is well-developed.  HENT:     Head: Normocephalic and atraumatic.  Eyes:     Conjunctiva/sclera: Conjunctivae normal.    Cardiovascular:     Rate and Rhythm: Regular rhythm. Tachycardia present.     Heart sounds: No murmur.  Pulmonary:     Effort: Pulmonary effort is normal. No respiratory distress.     Breath sounds: Normal breath sounds.  Abdominal:     Palpations: Abdomen is soft.     Tenderness: There is no abdominal tenderness.  Musculoskeletal:     Cervical back: Neck supple.  Skin:    General: Skin is warm and dry.  Neurological:     Mental Status: He is alert.     Comments: Appears anxious No tremors CIWA 4     ED Results / Procedures / Treatments   Labs (all labs ordered are listed, but only abnormal results are displayed) Labs Reviewed - No data to display  EKG None  Radiology No results found.  Procedures Procedures (including critical care time)  Medications Ordered in ED Medications - No data to display  ED Course  I have reviewed the triage vital signs and the nursing notes.  Pertinent labs & imaging results that were available during my care of the patient were reviewed by me and considered in my medical decision making (see chart for details).  38 year old male presented to emergency department with complaint of continued alcohol withdrawal, believes he was discharged too quickly from the hospital after 2 days today.  He has CIWA score of 4 here in the ED.  I believe it is appropriate to prescribe him Librium for a few days for his withdrawal symptoms.  I provided him with a list of resources to help for substance abuse.  I explained to him that Librium is not a long-term solution.  I explained to him that when he runs out of his taper, his anxiety will return, and needs to find other ways to cope with this.  He verbalized understanding.  He plans to drive to pick up script.  Since he is driving I will not give him a dose in the ED.    Final Clinical Impression(s) / ED Diagnoses Final diagnoses:  Anxiety  Withdrawal complaint    Rx / DC Orders ED Discharge  Orders         Ordered    chlordiazePOXIDE (LIBRIUM) 25 MG capsule     06/26/19 2247           Wyvonnia Dusky, MD 06/26/19 2306

## 2019-06-29 ENCOUNTER — Telehealth: Payer: Self-pay

## 2019-06-29 NOTE — Telephone Encounter (Signed)
Message received from Nancy Marus, RN CM requesting a hospital follow up appointment for the patient at [facility].  Call placed to the patient. He said that he did not want to schedule an appt because he is waiting for his insurance to start the first of next year and he will schedule an appt with a private physician.   He stated that he does not have a permanent address in [place], so his address is still listed as [facility].   He has the phone number for this clinic.  Instructed him to call the clinic if he has any further questions or decides that he would like to schedule an appointment.

## 2019-09-03 ENCOUNTER — Emergency Department (HOSPITAL_BASED_OUTPATIENT_CLINIC_OR_DEPARTMENT_OTHER): Payer: BC Managed Care – PPO

## 2019-09-03 ENCOUNTER — Emergency Department (HOSPITAL_BASED_OUTPATIENT_CLINIC_OR_DEPARTMENT_OTHER)
Admission: EM | Admit: 2019-09-03 | Discharge: 2019-09-03 | Disposition: A | Discharge status: Home / Self Care | Payer: BC Managed Care – PPO | Attending: Emergency Medicine | Admitting: Emergency Medicine

## 2019-09-03 ENCOUNTER — Other Ambulatory Visit: Payer: Self-pay

## 2019-09-03 ENCOUNTER — Encounter (HOSPITAL_BASED_OUTPATIENT_CLINIC_OR_DEPARTMENT_OTHER): Payer: Self-pay | Admitting: Emergency Medicine

## 2019-09-03 DIAGNOSIS — S0081XA Abrasion of other part of head, initial encounter: Secondary | ICD-10-CM | POA: Diagnosis not present

## 2019-09-03 DIAGNOSIS — Y939 Activity, unspecified: Secondary | ICD-10-CM | POA: Insufficient documentation

## 2019-09-03 DIAGNOSIS — Z79899 Other long term (current) drug therapy: Secondary | ICD-10-CM | POA: Diagnosis not present

## 2019-09-03 DIAGNOSIS — W19XXXA Unspecified fall, initial encounter: Secondary | ICD-10-CM | POA: Diagnosis not present

## 2019-09-03 DIAGNOSIS — R441 Visual hallucinations: Secondary | ICD-10-CM | POA: Insufficient documentation

## 2019-09-03 DIAGNOSIS — Y907 Blood alcohol level of 200-239 mg/100 ml: Secondary | ICD-10-CM | POA: Diagnosis not present

## 2019-09-03 DIAGNOSIS — Y999 Unspecified external cause status: Secondary | ICD-10-CM | POA: Insufficient documentation

## 2019-09-03 DIAGNOSIS — S0990XA Unspecified injury of head, initial encounter: Secondary | ICD-10-CM | POA: Insufficient documentation

## 2019-09-03 DIAGNOSIS — F10920 Alcohol use, unspecified with intoxication, uncomplicated: Secondary | ICD-10-CM

## 2019-09-03 DIAGNOSIS — Y9259 Other trade areas as the place of occurrence of the external cause: Secondary | ICD-10-CM | POA: Insufficient documentation

## 2019-09-03 DIAGNOSIS — F10220 Alcohol dependence with intoxication, uncomplicated: Secondary | ICD-10-CM | POA: Insufficient documentation

## 2019-09-03 LAB — CBC WITH DIFFERENTIAL/PLATELET
Abs Immature Granulocytes: 0.03 10*3/uL (ref 0.00–0.07)
Basophils Absolute: 0.1 10*3/uL (ref 0.0–0.1)
Basophils Relative: 1 %
Eosinophils Absolute: 0 10*3/uL (ref 0.0–0.5)
Eosinophils Relative: 0 %
HCT: 42.9 % (ref 39.0–52.0)
Hemoglobin: 14.9 g/dL (ref 13.0–17.0)
Immature Granulocytes: 0 %
Lymphocytes Relative: 24 %
Lymphs Abs: 2.3 10*3/uL (ref 0.7–4.0)
MCH: 30 pg (ref 26.0–34.0)
MCHC: 34.7 g/dL (ref 30.0–36.0)
MCV: 86.5 fL (ref 80.0–100.0)
Monocytes Absolute: 0.8 10*3/uL (ref 0.1–1.0)
Monocytes Relative: 8 %
Neutro Abs: 6.3 10*3/uL (ref 1.7–7.7)
Neutrophils Relative %: 67 %
Platelets: 321 10*3/uL (ref 150–400)
RBC: 4.96 MIL/uL (ref 4.22–5.81)
RDW: 12 % (ref 11.5–15.5)
WBC: 9.6 10*3/uL (ref 4.0–10.5)
nRBC: 0 % (ref 0.0–0.2)

## 2019-09-03 LAB — URINALYSIS, ROUTINE W REFLEX MICROSCOPIC
Bilirubin Urine: NEGATIVE
Glucose, UA: NEGATIVE mg/dL
Hgb urine dipstick: NEGATIVE
Ketones, ur: NEGATIVE mg/dL
Leukocytes,Ua: NEGATIVE
Nitrite: NEGATIVE
Protein, ur: NEGATIVE mg/dL
Specific Gravity, Urine: 1.01 (ref 1.005–1.030)
pH: 7 (ref 5.0–8.0)

## 2019-09-03 LAB — BASIC METABOLIC PANEL
Anion gap: 11 (ref 5–15)
BUN: 13 mg/dL (ref 6–20)
CO2: 26 mmol/L (ref 22–32)
Calcium: 8.3 mg/dL — ABNORMAL LOW (ref 8.9–10.3)
Chloride: 98 mmol/L (ref 98–111)
Creatinine, Ser: 0.84 mg/dL (ref 0.61–1.24)
GFR calc Af Amer: >60 mL/min (ref >60)
GFR calc non Af Amer: >60 mL/min (ref >60)
Glucose, Bld: 143 mg/dL — ABNORMAL HIGH (ref 70–99)
Potassium: 3.4 mmol/L — ABNORMAL LOW (ref 3.5–5.1)
Sodium: 135 mmol/L (ref 135–145)

## 2019-09-03 LAB — ETHANOL: Alcohol, Ethyl (B): 219 mg/dL — ABNORMAL HIGH (ref <10)

## 2019-09-03 MED ORDER — ONDANSETRON 4 MG PO TBDP
4.0000 mg | ORAL_TABLET | Freq: Once | ORAL | Status: AC
  Administered 2019-09-03: 4 mg via ORAL
  Filled 2019-09-03: qty 1

## 2019-09-03 MED ORDER — CHLORDIAZEPOXIDE HCL 25 MG PO CAPS: ORAL_CAPSULE | ORAL | 0 refills | Status: DC

## 2019-09-03 MED ORDER — CHLORDIAZEPOXIDE HCL 25 MG PO CAPS
25.0000 mg | ORAL_CAPSULE | Freq: Once | ORAL | Status: AC
  Administered 2019-09-03: 15:00:00 25 mg via ORAL
  Filled 2019-09-03: qty 1

## 2019-09-03 NOTE — ED Notes (Signed)
Cab voucher provided to pt for transport to [facility] at [address]. Registration is calling [facility].

## 2019-09-03 NOTE — ED Triage Notes (Signed)
Per EMS pt from hotel , Hx alcohol abuse , last drink 5 hours ago, reports nausea , and visual hallucination. Ambulatory

## 2019-09-03 NOTE — ED Notes (Signed)
[facility] does not carry Librium; rx changed to [facility] on [address].

## 2019-09-03 NOTE — Discharge Instructions (Signed)
Please take Librium as prescribed for your possible alcohol withdrawal.  If you take this medicine, you should be stopping your alcohol.  Please follow-up with primary doctor regarding ongoing management of alcohol abuse.  If you develop severe anxiety, tremulousness, vomiting, other new concerning symptom, return to ER for reassessment.

## 2019-09-03 NOTE — ED Provider Notes (Signed)
[facility] EMERGENCY DEPARTMENT Provider Note   CSN: [id number] Arrival date & time: 09/03/19  1403     History Chief Complaint  Patient presents with  . Medical Clearance    Leonard Atkinson is a 39 y.o. male.  Patient here as he is concerned about alcohol withdrawal.  He has been binge drinking for the last several days.  He last drank 4 hours ago.  He has abrasion to his forehead.  He states that he fell.  He feels like he is seeing things.  However he denies any suicidal homicidal ideation.  No tremors.  States that he was clean from alcohol after getting Librium 2 months ago.  Relapsed several days ago and has been drinking in a hotel.  The history is provided by the patient.  Alcohol Intoxication This is a chronic problem. The problem occurs daily. The problem has not changed since onset.Associated symptoms include headaches. Pertinent negatives include no chest pain, no abdominal pain and no shortness of breath. Nothing aggravates the symptoms. Nothing relieves the symptoms. He has tried nothing for the symptoms. The treatment provided no relief.       Past Medical History:  Diagnosis Date  . Alcohol abuse   . Anxiety     Patient Active Problem List   Diagnosis Date Noted  . 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 Hyperbilirubinemia 06/25/2019  . Obesity (BMI 30-39.9) 06/25/2019  . Leukocytosis 06/25/2019  . Sinus tachycardia 06/25/2019  . Hyperglycemia 06/25/2019  . Alcohol withdrawal ([place]) 06/24/2019    History reviewed. No pertinent surgical history.     No family history on file.  Social History   Tobacco Use  . Smoking status: Never Smoker  . Smokeless tobacco: Never Used  Substance Use Topics  . Alcohol use: Yes    Comment: pt rpeorts 24 beers in the last 24 hours   . Drug use: Never    Home Medications Prior to Admission medications   Medication Sig Start Date End Date Taking? Authorizing Provider    acetaminophen (TYLENOL) 500 MG tablet Take 500 mg by mouth every 6 (six) hours as needed for mild pain or headache.    [provider]  chlordiazePOXIDE (LIBRIUM) 10 MG capsule Take 10 mg by mouth 3 (three) times daily as needed for anxiety.    [provider]  chlordiazePOXIDE (LIBRIUM) 25 MG capsule 50mg  PO TID x 1D, then 25-50mg  PO BID X 1D, then 25-50mg  PO QD X 1D 06/26/19   Trifan, Carola Rhine, MD  folic acid (FOLVITE) 1 MG tablet Take 1 tablet (1 mg total) by mouth daily. 06/27/19   Shelly Coss, MD  thiamine 100 MG tablet Take 1 tablet (100 mg total) by mouth daily. 06/27/19   Shelly Coss, MD    Allergies    Patient has no known allergies.  Review of Systems   Review of Systems  Constitutional: Negative for chills and fever.  HENT: Negative for ear pain and sore throat.   Eyes: Negative for pain and visual disturbance.  Respiratory: Negative for cough and shortness of breath.   Cardiovascular: Negative for chest pain and palpitations.  Gastrointestinal: Negative for abdominal pain and vomiting.  Genitourinary: Negative for dysuria and hematuria.  Musculoskeletal: Negative for arthralgias and back pain.  Skin: Negative for color change and rash.  Neurological: Positive for headaches. Negative for dizziness, tremors, seizures, syncope, facial asymmetry, speech difficulty, light-headedness and numbness.  Psychiatric/Behavioral: Positive for hallucinations (possibly?). Negative for suicidal ideas.  The patient is not nervous/anxious.   All other systems reviewed and are negative.   Physical Exam Updated Vital Signs  ED Triage Vitals  Enc Vitals Group     BP 09/03/19 1411 (!) 145/113     Pulse Rate 09/03/19 1411 96     Resp 09/03/19 1411 16     Temp 09/03/19 1411 98 F (36.7 C)     Temp Source 09/03/19 1411 Oral     SpO2 09/03/19 1404 97 %     Weight 09/03/19 1413 210 lb (95.3 kg)     Height 09/03/19 1413 5\' 9"  (1.753 m)     Head Circumference --       Peak Flow --      Pain Score 09/03/19 1412 4     Pain Loc --      Pain Edu? --      Excl. in GC? --     Physical Exam Vitals and nursing note reviewed.  Constitutional:      Appearance: He is well-developed.  HENT:     Head: Normocephalic and atraumatic.     Nose: Nose normal.     Mouth/Throat:     Mouth: Mucous membranes are moist.  Eyes:     Extraocular Movements: Extraocular movements intact.     Conjunctiva/sclera: Conjunctivae normal.     Pupils: Pupils are equal, round, and reactive to light.  Cardiovascular:     Rate and Rhythm: Normal rate and regular rhythm.     Heart sounds: No murmur.  Pulmonary:     Effort: Pulmonary effort is normal. No respiratory distress.     Breath sounds: Normal breath sounds.  Abdominal:     Palpations: Abdomen is soft.     Tenderness: There is no abdominal tenderness.  Musculoskeletal:     Cervical back: Neck supple.  Skin:    General: Skin is warm and dry.     Comments: Abrasion to forehead  Neurological:     General: No focal deficit present.     Mental Status: He is alert and oriented to person, place, and time.     Cranial Nerves: No cranial nerve deficit.     Sensory: No sensory deficit.     Motor: No weakness.     Coordination: Coordination normal.     Gait: Gait normal.     Comments: 5+ out of 5 strength throughout, normal sensation, no drift, normal speech, normal gait, overall well-appearing  Psychiatric:        Mood and Affect: Mood normal.        Behavior: Behavior normal.        Thought Content: Thought content normal.        Judgment: Judgment normal.     Comments: Denies suicidal homicidal ideation does not appear to be hallucinating     ED Results / Procedures / Treatments   Labs (all labs ordered are listed, but only abnormal results are displayed) Labs Reviewed - No data to display  EKG None  Radiology CT Head Wo Contrast  Result Date: 09/03/2019 CLINICAL DATA:  Acute headache. Fall with head injury  on table. Hallucinations. EXAM: CT HEAD WITHOUT CONTRAST TECHNIQUE: Contiguous axial images were obtained from the base of the skull through the vertex without intravenous contrast. COMPARISON:  None. FINDINGS: Brain: No evidence of acute infarction, hemorrhage, hydrocephalus, extra-axial collection or mass lesion/mass effect. Vascular: No hyperdense vessel or unexpected calcification. Skull: Normal. Negative for fracture or focal lesion. Sinuses/Orbits: Mild mucosal thickening noted  involving the maxillary sinuses. Other: None. IMPRESSION: 1. No acute intracranial abnormalities. Normal brain. 2. Mild maxillary sinus inflammation. Electronically Signed   By: Signa Kell M.D.   On: 09/03/2019 14:53    Procedures Procedures (including critical care time)  Medications Ordered in ED Medications  chlordiazePOXIDE (LIBRIUM) capsule 25 mg (has no administration in time range)  ondansetron (ZOFRAN-ODT) disintegrating tablet 4 mg (has no administration in time range)    ED Course  I have reviewed the triage vital signs and the nursing notes.  Pertinent labs & imaging results that were available during my care of the patient were reviewed by me and considered in my medical decision making (see chart for details).    MDM Rules/Calculators/A&P                      Leonard Atkinson is a 39 year old male with history of alcohol abuse who presents to the ED with nausea and some visual hallucinations.  Patient with normal vitals.  No fever.  Does admit to binge drinking over the last several days.  Last drink was 5 hours ago.  Does have abrasion to his forehead.  States that he fell.  However he appears neurologically intact.  He is ambulatory.  He keeps saying that he is having hallucinations but is not very specific about it.  States that he thinks that he is seeing things.  However patient appears coherent and is able to ambulate without any issues.  He thinks that he is withdrawing from alcohol.  He recently  got sober and he states that he relapsed.  We will give him Librium and Zofran for symptomatic relief and for withdrawal prevention.  However suspect still with intoxication.  Will obtain head CT to rule out intracranial process given fall.  Head CT unremarkable.  No head bleed.  Will try to get some symptomatic relief.  Patient denies any suicidal homicidal ideation.  He does not appear manic.  Anticipate discharge with Librium prescription.  Signed out to oncoming ED staff with patient pending reevaluation.  This chart was dictated using voice recognition software.  Despite best efforts to proofread,  errors can occur which can change the documentation meaning.    Final Clinical Impression(s) / ED Diagnoses Final diagnoses:  Alcoholic intoxication without complication [facility])    Rx / DC Orders ED Discharge Orders    None       Virgina Norfolk, DO 09/03/19 1458

## 2019-11-05 ENCOUNTER — Encounter (HOSPITAL_COMMUNITY): Payer: Self-pay

## 2019-11-05 ENCOUNTER — Other Ambulatory Visit: Payer: Self-pay

## 2019-11-05 ENCOUNTER — Emergency Department (HOSPITAL_COMMUNITY)
Admission: EM | Admit: 2019-11-05 | Discharge: 2019-11-05 | Disposition: A | Discharge status: Home / Self Care | Payer: BC Managed Care – PPO | Attending: Emergency Medicine | Admitting: Emergency Medicine

## 2019-11-05 DIAGNOSIS — F17210 Nicotine dependence, cigarettes, uncomplicated: Secondary | ICD-10-CM | POA: Insufficient documentation

## 2019-11-05 DIAGNOSIS — Z79899 Other long term (current) drug therapy: Secondary | ICD-10-CM | POA: Diagnosis not present

## 2019-11-05 DIAGNOSIS — F10230 Alcohol dependence with withdrawal, uncomplicated: Secondary | ICD-10-CM | POA: Diagnosis present

## 2019-11-05 DIAGNOSIS — F10930 Alcohol use, unspecified with withdrawal, uncomplicated: Secondary | ICD-10-CM

## 2019-11-05 HISTORY — DX: Other chronic pain: G89.29

## 2019-11-05 MED ORDER — LORAZEPAM 2 MG/ML IJ SOLN
1.0000 mg | Freq: Once | INTRAMUSCULAR | Status: AC
  Administered 2019-11-05: 21:00:00 1 mg via INTRAVENOUS
  Filled 2019-11-05: qty 1

## 2019-11-05 MED ORDER — FOLIC ACID 1 MG PO TABS
1.0000 mg | ORAL_TABLET | Freq: Once | ORAL | Status: AC
  Administered 2019-11-05: 1 mg via ORAL
  Filled 2019-11-05: qty 1

## 2019-11-05 MED ORDER — LACTATED RINGERS IV BOLUS
1000.0000 mL | Freq: Once | INTRAVENOUS | Status: AC
  Administered 2019-11-05: 1000 mL via INTRAVENOUS

## 2019-11-05 MED ORDER — ONDANSETRON HCL 4 MG/2ML IJ SOLN
4.0000 mg | Freq: Once | INTRAMUSCULAR | Status: AC
  Administered 2019-11-05: 20:00:00 4 mg via INTRAVENOUS
  Filled 2019-11-05: qty 2

## 2019-11-05 MED ORDER — THIAMINE HCL 100 MG PO TABS
100.0000 mg | ORAL_TABLET | Freq: Once | ORAL | Status: AC
  Administered 2019-11-05: 100 mg via ORAL
  Filled 2019-11-05: qty 1

## 2019-11-05 MED ORDER — ADULT MULTIVITAMIN W/MINERALS CH
1.0000 | ORAL_TABLET | Freq: Once | ORAL | Status: AC
  Administered 2019-11-05: 1 via ORAL
  Filled 2019-11-05: qty 1

## 2019-11-05 MED ORDER — LORAZEPAM 2 MG/ML IJ SOLN
1.5000 mg | Freq: Once | INTRAMUSCULAR | Status: AC
  Administered 2019-11-05: 1.5 mg via INTRAVENOUS
  Filled 2019-11-05: qty 1

## 2019-11-05 MED ORDER — CHLORDIAZEPOXIDE HCL 25 MG PO CAPS: ORAL_CAPSULE | ORAL | 0 refills | Status: DC

## 2019-11-05 NOTE — ED Triage Notes (Signed)
Patient states he is having withdrawal from alcohol. Patient states his last drink was yesterday. Patient states he has been drinking heavy for the past 4 days. Patient states he tried to quit "cold [place]." Patient  C/o headache and hallucinations

## 2019-11-05 NOTE — ED Notes (Signed)
An After Visit Summary was printed and given to the patient. Discharge instructions given and no further questions at this time. Pt states friend is taking him home.

## 2019-11-09 NOTE — ED Provider Notes (Signed)
[facility] DEPT Provider Note   CSN: [id number] Arrival date & time: 11/05/19  1845     History Chief Complaint  Patient presents with  . withdrawal from Platter is a 39 y.o. male.  HPI   58yM with etoh abuse and withdrawal symptoms. Long hx of abuse. Last drink yesterday. Feels shaky today. Denies prior seizure with withdrawal but "has seen things crawling." no acute pain. No si or hi.   Past Medical History:  Diagnosis Date  . Alcohol abuse   . Anxiety   . Chronic back pain     Patient Active Problem List   Diagnosis Date Noted  . 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 Hyperbilirubinemia 06/25/2019  . Obesity (BMI 30-39.9) 06/25/2019  . Leukocytosis 06/25/2019  . Sinus tachycardia 06/25/2019  . Hyperglycemia 06/25/2019  . Alcohol withdrawal ([place]) 06/24/2019    History reviewed. No pertinent surgical history.     History reviewed. No pertinent family history.  Social History   Tobacco Use  . Smoking status: Current Some Day Smoker    Types: Cigarettes  . Smokeless tobacco: Never Used  Substance Use Topics  . Alcohol use: Yes  . Drug use: Never    Home Medications Prior to Admission medications   Medication Sig Start Date End Date Taking? Authorizing Provider  acetaminophen (TYLENOL) 500 MG tablet Take 500 mg by mouth every 6 (six) hours as needed for mild pain or headache.   Yes [provider]  chlordiazePOXIDE (LIBRIUM) 25 MG capsule 50mg  PO TID x 1D, then 25-50mg  PO BID X 1D, then 25-50mg  PO QD X 1D 11/05/19   Virgel Manifold, MD  folic acid (FOLVITE) 1 MG tablet Take 1 tablet (1 mg total) by mouth daily. Patient not taking: Reported on 11/05/2019 06/27/19   Shelly Coss, MD  thiamine 100 MG tablet Take 1 tablet (100 mg total) by mouth daily. Patient not taking: Reported on 11/05/2019 06/27/19   Shelly Coss, MD    Allergies    Patient has no known  allergies.  Review of Systems   Review of Systems All systems reviewed and negative, other than as noted in HPI.  Physical Exam Updated Vital Signs BP (!) 144/86   Pulse 90   Temp 98.6 F (37 C) (Oral)   Resp 18   Ht 5\' 9"  (1.753 m)   Wt 95.3 kg   SpO2 99%   BMI 31.01 kg/m   Physical Exam Vitals and nursing note reviewed.  Constitutional:      General: He is not in acute distress.    Appearance: He is well-developed.  HENT:     Head: Normocephalic and atraumatic.  Eyes:     General:        Right eye: No discharge.        Left eye: No discharge.     Conjunctiva/sclera: Conjunctivae normal.  Cardiovascular:     Rate and Rhythm: Normal rate and regular rhythm.     Heart sounds: Normal heart sounds. No murmur. No friction rub. No gallop.   Pulmonary:     Effort: Pulmonary effort is normal. No respiratory distress.     Breath sounds: Normal breath sounds.  Abdominal:     General: There is no distension.     Palpations: Abdomen is soft.     Tenderness: There is no abdominal tenderness.  Musculoskeletal:        General: No tenderness.  Cervical back: Neck supple.  Skin:    General: Skin is warm and dry.  Neurological:     Mental Status: He is alert and oriented to person, place, and time.     Comments: Mild resting tremor  Psychiatric:        Behavior: Behavior normal.        Thought Content: Thought content normal.     ED Results / Procedures / Treatments   Labs (all labs ordered are listed, but only abnormal results are displayed) Labs Reviewed - No data to display  EKG None  Radiology No results found.  Procedures Procedures (including critical care time)  Medications Ordered in ED Medications  lactated ringers bolus 1,000 mL (0 mLs Intravenous Stopped 11/05/19 2127)  ondansetron (ZOFRAN) injection 4 mg (4 mg Intravenous Given 11/05/19 1941)  thiamine tablet 100 mg (100 mg Oral Given 11/05/19 1942)  folic acid (FOLVITE) tablet 1 mg (1 mg Oral  Given 11/05/19 1942)  multivitamin with minerals tablet 1 tablet (1 tablet Oral Given 11/05/19 1942)  LORazepam (ATIVAN) injection 1.5 mg (1.5 mg Intravenous Given 11/05/19 1941)  LORazepam (ATIVAN) injection 1 mg (1 mg Intravenous Given 11/05/19 2120)    ED Course  I have reviewed the triage vital signs and the nursing notes.  Pertinent labs & imaging results that were available during my care of the patient were reviewed by me and considered in my medical decision making (see chart for details).    MDM Rules/Calculators/A&P                      38ym with etoh withdrawal. I think appropriate for DC. No SI or HI. WIll place on librium. Outpt resources provided.   Final Clinical Impression(s) / ED Diagnoses Final diagnoses:  Alcohol withdrawal syndrome without complication [facility])    Rx / DC Orders ED Discharge Orders         Ordered    chlordiazePOXIDE (LIBRIUM) 25 MG capsule     11/05/19 2115           Raeford Razor, MD 11/09/19 2105

## 2019-11-29 ENCOUNTER — Other Ambulatory Visit: Payer: Self-pay

## 2019-11-29 ENCOUNTER — Emergency Department (HOSPITAL_BASED_OUTPATIENT_CLINIC_OR_DEPARTMENT_OTHER)
Admission: EM | Admit: 2019-11-29 | Discharge: 2019-11-29 | Disposition: A | Discharge status: Home / Self Care | Payer: BC Managed Care – PPO | Attending: Emergency Medicine | Admitting: Emergency Medicine

## 2019-11-29 ENCOUNTER — Encounter (HOSPITAL_BASED_OUTPATIENT_CLINIC_OR_DEPARTMENT_OTHER): Payer: Self-pay | Admitting: Emergency Medicine

## 2019-11-29 DIAGNOSIS — R61 Generalized hyperhidrosis: Secondary | ICD-10-CM | POA: Diagnosis not present

## 2019-11-29 DIAGNOSIS — M7918 Myalgia, other site: Secondary | ICD-10-CM | POA: Insufficient documentation

## 2019-11-29 DIAGNOSIS — R112 Nausea with vomiting, unspecified: Secondary | ICD-10-CM | POA: Diagnosis not present

## 2019-11-29 DIAGNOSIS — F10239 Alcohol dependence with withdrawal, unspecified: Secondary | ICD-10-CM | POA: Diagnosis present

## 2019-11-29 DIAGNOSIS — F419 Anxiety disorder, unspecified: Secondary | ICD-10-CM | POA: Diagnosis not present

## 2019-11-29 DIAGNOSIS — F1910 Other psychoactive substance abuse, uncomplicated: Secondary | ICD-10-CM | POA: Diagnosis not present

## 2019-11-29 DIAGNOSIS — F17210 Nicotine dependence, cigarettes, uncomplicated: Secondary | ICD-10-CM | POA: Diagnosis not present

## 2019-11-29 LAB — CBC WITH DIFFERENTIAL/PLATELET
Abs Immature Granulocytes: 0.05 10*3/uL (ref 0.00–0.07)
Basophils Absolute: 0.1 10*3/uL (ref 0.0–0.1)
Basophils Relative: 1 %
Eosinophils Absolute: 0 10*3/uL (ref 0.0–0.5)
Eosinophils Relative: 0 %
HCT: 42.8 % (ref 39.0–52.0)
Hemoglobin: 15.3 g/dL (ref 13.0–17.0)
Immature Granulocytes: 0 %
Lymphocytes Relative: 16 %
Lymphs Abs: 1.8 10*3/uL (ref 0.7–4.0)
MCH: 30.2 pg (ref 26.0–34.0)
MCHC: 35.7 g/dL (ref 30.0–36.0)
MCV: 84.4 fL (ref 80.0–100.0)
Monocytes Absolute: 0.9 10*3/uL (ref 0.1–1.0)
Monocytes Relative: 8 %
Neutro Abs: 8.4 10*3/uL — ABNORMAL HIGH (ref 1.7–7.7)
Neutrophils Relative %: 75 %
Platelets: 326 10*3/uL (ref 150–400)
RBC: 5.07 MIL/uL (ref 4.22–5.81)
RDW: 11.9 % (ref 11.5–15.5)
WBC: 11.3 10*3/uL — ABNORMAL HIGH (ref 4.0–10.5)
nRBC: 0 % (ref 0.0–0.2)

## 2019-11-29 LAB — COMPREHENSIVE METABOLIC PANEL
ALT: 41 U/L (ref 0–44)
AST: 35 U/L (ref 15–41)
Albumin: 4.4 g/dL (ref 3.5–5.0)
Alkaline Phosphatase: 74 U/L (ref 38–126)
Anion gap: 15 (ref 5–15)
BUN: 13 mg/dL (ref 6–20)
CO2: 24 mmol/L (ref 22–32)
Calcium: 8.8 mg/dL — ABNORMAL LOW (ref 8.9–10.3)
Chloride: 95 mmol/L — ABNORMAL LOW (ref 98–111)
Creatinine, Ser: 0.9 mg/dL (ref 0.61–1.24)
GFR calc Af Amer: >60 mL/min (ref >60)
GFR calc non Af Amer: >60 mL/min (ref >60)
Glucose, Bld: 114 mg/dL — ABNORMAL HIGH (ref 70–99)
Potassium: 3.2 mmol/L — ABNORMAL LOW (ref 3.5–5.1)
Sodium: 134 mmol/L — ABNORMAL LOW (ref 135–145)
Total Bilirubin: 2.9 mg/dL — ABNORMAL HIGH (ref 0.3–1.2)
Total Protein: 7.6 g/dL (ref 6.5–8.1)

## 2019-11-29 MED ORDER — LORAZEPAM 2 MG/ML IJ SOLN
2.0000 mg | Freq: Once | INTRAMUSCULAR | Status: AC
  Administered 2019-11-29: 2 mg via INTRAVENOUS
  Filled 2019-11-29: qty 1

## 2019-11-29 MED ORDER — SODIUM CHLORIDE 0.9 % IV BOLUS
1000.0000 mL | Freq: Once | INTRAVENOUS | Status: AC
  Administered 2019-11-29: 1000 mL via INTRAVENOUS

## 2019-11-29 MED ORDER — THIAMINE HCL 100 MG/ML IJ SOLN
100.0000 mg | Freq: Once | INTRAMUSCULAR | Status: AC
  Administered 2019-11-29: 100 mg via INTRAVENOUS
  Filled 2019-11-29: qty 2

## 2019-11-29 MED ORDER — CHLORDIAZEPOXIDE HCL 25 MG PO CAPS
ORAL_CAPSULE | ORAL | 0 refills | Status: DC
Start: 2019-11-29 — End: 2020-01-21

## 2019-11-29 MED ORDER — CHLORDIAZEPOXIDE HCL 25 MG PO CAPS
ORAL_CAPSULE | ORAL | 0 refills | Status: DC
Start: 2019-11-29 — End: 2019-11-29

## 2019-11-29 NOTE — ED Triage Notes (Signed)
Last drink yesterday am, states having withdrawal sx today.

## 2019-11-29 NOTE — ED Provider Notes (Signed)
[facility] EMERGENCY DEPARTMENT Provider Note   CSN: [id number] Arrival date & time: 11/29/19  1914     History Chief Complaint  Patient presents with  . Alcohol Problem    Leonard Atkinson is a 39 y.o. male.  HPI Patient presents emergency department with a chief complaint of alcohol withdrawal.  Patient states his last drink was yesterday morning.  Patient states that he feels anxious, sweaty, body aches, and has had one episode of vomiting yesterday.  Patient also admits to hallucinations (seeing bugs) started about 2 hours ago.  Patient denies seizures, any suicidal thoughts ideations or thoughts of hurting others.  She states this feels like a withdrawal he had a few months ago.  Patient does not have any significant medical history.    Past Medical History:  Diagnosis Date  . Alcohol abuse   . Anxiety   . Chronic back pain     Patient Active Problem List   Diagnosis Date Noted  . 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 Hyperbilirubinemia 06/25/2019  . Obesity (BMI 30-39.9) 06/25/2019  . Leukocytosis 06/25/2019  . Sinus tachycardia 06/25/2019  . Hyperglycemia 06/25/2019  . Alcohol withdrawal ([facility]) 06/24/2019    History reviewed. No pertinent surgical history.     No family history on file.  Social History   Tobacco Use  . Smoking status: Current Some Day Smoker    Types: Cigarettes  . Smokeless tobacco: Never Used  Substance Use Topics  . Alcohol use: Yes  . Drug use: Never    Home Medications Prior to Admission medications   Medication Sig Start Date End Date Taking? Authorizing Provider  acetaminophen (TYLENOL) 500 MG tablet Take 500 mg by mouth every 6 (six) hours as needed for mild pain or headache.    [provider]  chlordiazePOXIDE (LIBRIUM) 25 MG capsule 50mg  PO TID x 1D, then 25-50mg  PO BID X 1D, then 25-50mg  PO QD X 1D 11/29/19   12/01/19 A, PA-C  folic acid (FOLVITE) 1 MG tablet Take  1 tablet (1 mg total) by mouth daily. Patient not taking: Reported on 11/05/2019 06/27/19   06/29/19, MD  thiamine 100 MG tablet Take 1 tablet (100 mg total) by mouth daily. Patient not taking: Reported on 11/05/2019 06/27/19   06/29/19, MD    Allergies    Patient has no known allergies.  Review of Systems   Review of Systems  Constitutional: Positive for appetite change, chills and diaphoresis. Negative for fever.       Decreased appetite  HENT: Negative for congestion and sore throat.   Eyes: Negative for visual disturbance.  Respiratory: Negative for cough and shortness of breath.   Cardiovascular: Negative for chest pain and leg swelling.  Gastrointestinal: Positive for nausea and vomiting. Negative for abdominal pain.       Patient had one episode of vomiting yesterday and feels a little nauseous.  Genitourinary: Negative for enuresis, flank pain and hematuria.  Musculoskeletal: Negative for back pain.  Skin: Negative for rash.  Neurological: Negative for dizziness and headaches.  Hematological: Does not bruise/bleed easily.  Psychiatric/Behavioral: Negative for agitation, self-injury and suicidal ideas. The patient is nervous/anxious.     Physical Exam Updated Vital Signs BP (!) 144/100   Pulse (!) 108   Temp 99.4 F (37.4 C) (Oral)   Resp (!) 22   Ht 5\' 9"  (1.753 m)   Wt 96 kg   SpO2 100%   BMI 31.25 kg/m  Physical Exam Vitals and nursing note reviewed.  Constitutional:      General: He is not in acute distress.    Appearance: He is not ill-appearing.  HENT:     Head: Normocephalic and atraumatic.     Nose: No congestion.     Mouth/Throat:     Mouth: Mucous membranes are moist.     Pharynx: Oropharynx is clear.  Eyes:     General: No scleral icterus. Cardiovascular:     Rate and Rhythm: Normal rate and regular rhythm.     Pulses: Normal pulses.     Heart sounds: No murmur. No friction rub. No gallop.   Pulmonary:     Effort: No respiratory  distress.     Breath sounds: No wheezing, rhonchi or rales.  Abdominal:     General: There is no distension.     Tenderness: There is no abdominal tenderness. There is no guarding.  Musculoskeletal:        General: No swelling.  Skin:    General: Skin is warm and dry.     Capillary Refill: Capillary refill takes less than 2 seconds.     Findings: No rash.  Neurological:     Mental Status: He is alert and oriented to person, place, and time.  Psychiatric:        Mood and Affect: Mood normal.     ED Results / Procedures / Treatments   Labs (all labs ordered are listed, but only abnormal results are displayed) Labs Reviewed  COMPREHENSIVE METABOLIC PANEL - Abnormal; Notable for the following components:      Result Value   Sodium 134 (*)    Potassium 3.2 (*)    Chloride 95 (*)    Glucose, Bld 114 (*)    Calcium 8.8 (*)    Total Bilirubin 2.9 (*)    All other components within normal limits  CBC WITH DIFFERENTIAL/PLATELET - Abnormal; Notable for the following components:   WBC 11.3 (*)    Neutro Abs 8.4 (*)    All other components within normal limits    EKG None  Radiology No results found.  Procedures Procedures (including critical care time)  Medications Ordered in ED Medications  sodium chloride 0.9 % bolus 1,000 mL (0 mLs Intravenous Stopped 11/29/19 2112)  LORazepam (ATIVAN) injection 2 mg (2 mg Intravenous Given 11/29/19 2006)  thiamine (B-1) injection 100 mg (100 mg Intravenous Given 11/29/19 2006)  LORazepam (ATIVAN) injection 2 mg (2 mg Intravenous Given 11/29/19 2218)    ED Course  I have reviewed the triage vital signs and the nursing notes.  Pertinent labs & imaging results that were available during my care of the patient were reviewed by me and considered in my medical decision making (see chart for details).    MDM Rules/Calculators/A&P                     I personally reviewed labs and imaging have interpreted myself.  CMP showed sodium of 134,  potassium 3.2, chloride of 95, calcium 8.8, and a total bilirubin of 2.9.  CBC showed an elevated white count of 11.3 and a elevated neutrophil count of 8.4 no signs of anemia.  Patient is afebrile, tachycardic, not tachypneic and satting at 100% room air.  I have low suspicion that the patient is suffering from alcoholic withdrawal seizures or alcoholic withdrawal psychosis as the patient has not had a seizure and vital signs have remained stable.  Patient was given Ativan,  IV fluid and will observe for improvement.  I have low suspicion the patient is suffering from a PE as she is he denies any shortness of breath, chest pain, denies leg pain or has any swelling in his legs.   After given patient IV fluid and Ativan patient states that he is feeling much better, denies any nausea, vomiting, palpitations, hallucinations, anxiety.  Patient's vitals have remained stable and he does not meet criteria for acute medical admission.  Patient was given Librium for alcohol withdrawal, given return precautions.  Patient states that he understood understood's results and plan and agrees with said plan.  Final Clinical Impression(s) / ED Diagnoses Final diagnoses:  Substance abuse [facility])    Rx / DC Orders ED Discharge Orders         Ordered    chlordiazePOXIDE (LIBRIUM) 25 MG capsule     11/29/19 2259           Marcello Fennel, PA-C 11/29/19 2308    Blanchie Dessert, MD 11/30/19 2059

## 2019-11-29 NOTE — Discharge Instructions (Addendum)
Your treated for alcohol withdrawal.  You have been prescribed Librium please take as prescribed.    Also given resource guide for substance abuse counseling.  Follow-up with counseling for substance abuse.  I want you to come back to emergency department if you experience uncontrolled nausea, vomiting, fever, palpitations, panic attacks, hallucinations, seizures [place] require further evaluation and management.

## 2019-12-15 ENCOUNTER — Encounter (HOSPITAL_BASED_OUTPATIENT_CLINIC_OR_DEPARTMENT_OTHER): Payer: Self-pay

## 2019-12-15 ENCOUNTER — Emergency Department (HOSPITAL_BASED_OUTPATIENT_CLINIC_OR_DEPARTMENT_OTHER): Payer: BC Managed Care – PPO

## 2019-12-15 ENCOUNTER — Other Ambulatory Visit: Payer: Self-pay

## 2019-12-15 ENCOUNTER — Emergency Department (HOSPITAL_BASED_OUTPATIENT_CLINIC_OR_DEPARTMENT_OTHER)
Admission: EM | Admit: 2019-12-15 | Discharge: 2019-12-16 | Disposition: A | Discharge status: Home / Self Care | Payer: BC Managed Care – PPO | Attending: Emergency Medicine | Admitting: Emergency Medicine

## 2019-12-15 DIAGNOSIS — G8929 Other chronic pain: Secondary | ICD-10-CM | POA: Insufficient documentation

## 2019-12-15 DIAGNOSIS — F17210 Nicotine dependence, cigarettes, uncomplicated: Secondary | ICD-10-CM | POA: Insufficient documentation

## 2019-12-15 DIAGNOSIS — Z765 Malingerer [conscious simulation]: Secondary | ICD-10-CM | POA: Diagnosis not present

## 2019-12-15 DIAGNOSIS — Z79899 Other long term (current) drug therapy: Secondary | ICD-10-CM | POA: Diagnosis not present

## 2019-12-15 DIAGNOSIS — M545 Low back pain, unspecified: Secondary | ICD-10-CM

## 2019-12-15 NOTE — ED Triage Notes (Signed)
Pt c/o lower back pain x 20 mins-denies injury-hx chronic back pain-to triage in w/c

## 2019-12-16 ENCOUNTER — Encounter (HOSPITAL_BASED_OUTPATIENT_CLINIC_OR_DEPARTMENT_OTHER): Payer: Self-pay | Admitting: Emergency Medicine

## 2019-12-16 MED ORDER — KETOROLAC TROMETHAMINE 60 MG/2ML IM SOLN
INTRAMUSCULAR | Status: AC
  Filled 2019-12-16: qty 2

## 2019-12-16 MED ORDER — LIDOCAINE 5 % EX PTCH
MEDICATED_PATCH | CUTANEOUS | Status: AC
  Filled 2019-12-16: qty 1

## 2019-12-16 NOTE — ED Notes (Signed)
  Patient came outside of fast track room stating he would like to leave and to wheel him outside.  Patient was placed in a wheelchair and taken to front door.  Attempted to give patient paperwork and he stated "shove it up your ass".  Paperwork was placed in belongings bag and given to patient.  [employer] civil [employer] notified.

## 2019-12-16 NOTE — ED Notes (Signed)
Went in to discharge pt   Pt very upset states he has been here for 5 hours and feels nothing has been done to help him  Requesting to speak to the dr  EDP notified

## 2019-12-16 NOTE — ED Provider Notes (Addendum)
[facility] Provider Note   CSN: [id number] Arrival date & time: 12/15/19  2129     History Chief Complaint  Patient presents with  . Back Pain    [facility] is a 39 y.o. male.  The history is provided by the patient.  Back Pain Location:  Lumbar spine Quality:  Cramping Radiates to:  Does not radiate Pain severity:  Severe Pain is:  Same all the time Onset quality:  Gradual Duration: years  Timing:  Constant Progression:  Unchanged Chronicity:  Chronic Context: not twisting   Relieved by:  Nothing Worsened by:  Nothing Ineffective treatments:  None tried Associated symptoms: no abdominal pain, no abdominal swelling, no bladder incontinence, no bowel incontinence, no chest pain, no dysuria, no fever, no headaches, no leg pain, no numbness, no paresthesias, no pelvic pain, no perianal numbness, no tingling, no weakness and no weight loss   Risk factors: no hx of cancer and no hx of osteoporosis   Patient with chronic back pain presents with years of ongoing back pain. States he just moved here and needs to get a doctor for the pain as he cannot tolerate anymore after all these years. Patient denies any trauma within the past year.  No falls.  No weakness, nor numbness in the extremities.  No leg pain..  No bowel nor bladder incontinence.  Is passing urine normally.  No fevers.  Patient denies IVDA.  States he has not taken anything for the pain and has been on his feet working all day.       Past Medical History:  Diagnosis Date  . Alcohol abuse   . Anxiety   . Chronic back pain     Patient Active Problem List   Diagnosis Date Noted  . 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 Hyperbilirubinemia 06/25/2019  . Obesity (BMI 30-39.9) 06/25/2019  . Leukocytosis 06/25/2019  . Sinus tachycardia 06/25/2019  . Hyperglycemia 06/25/2019  . Alcohol withdrawal ([place]) 06/24/2019    History reviewed. No  pertinent surgical history.     No family history on file.  Social History   Tobacco Use  . Smoking status: Current Some Day Smoker    Types: Cigarettes  . Smokeless tobacco: Never Used  Substance Use Topics  . Alcohol use: Yes    Comment: occ  . Drug use: Never    Home Medications Prior to Admission medications   Medication Sig Start Date End Date Taking? Authorizing Provider  acetaminophen (TYLENOL) 500 MG tablet Take 500 mg by mouth every 6 (six) hours as needed for mild pain or headache.    [provider]  chlordiazePOXIDE (LIBRIUM) 25 MG capsule 50mg  PO TID x 1D, then 25-50mg  PO BID X 1D, then 25-50mg  PO QD X 1D 11/29/19   Providence Lanius A, PA-C  folic acid (FOLVITE) 1 MG tablet Take 1 tablet (1 mg total) by mouth daily. Patient not taking: Reported on 11/05/2019 06/27/19   Shelly Coss, MD  thiamine 100 MG tablet Take 1 tablet (100 mg total) by mouth daily. Patient not taking: Reported on 11/05/2019 06/27/19   Shelly Coss, MD    Allergies    Patient has no known allergies.  Review of Systems   Review of Systems  Constitutional: Negative for fever and weight loss.  HENT: Negative for congestion.   Eyes: Negative for visual disturbance.  Respiratory: Negative for shortness of breath.   Cardiovascular: Negative for chest pain and leg swelling.  Gastrointestinal:  Negative for abdominal distention, abdominal pain, bowel incontinence, constipation and diarrhea.  Genitourinary: Negative for bladder incontinence, difficulty urinating, dysuria and pelvic pain.  Musculoskeletal: Positive for back pain. Negative for gait problem and joint swelling.  Skin: Negative for rash and wound.  Neurological: Negative for tingling, weakness, numbness, headaches and paresthesias.  Psychiatric/Behavioral: The patient is not nervous/anxious.   All other systems reviewed and are negative.   Physical Exam Updated Vital Signs BP (!) 137/95 (BP Location: Left Arm)   Pulse  84   Temp 98.6 F (37 C)   Resp 20   Ht 5\' 9"  (1.753 m)   Wt 95.3 kg   SpO2 99%   BMI 31.01 kg/m   Physical Exam Vitals and nursing note reviewed.  Constitutional:      General: He is not in acute distress.    Appearance: Normal appearance.  HENT:     Head: Normocephalic and atraumatic.     Nose: Nose normal.  Eyes:     Conjunctiva/sclera: Conjunctivae normal.     Pupils: Pupils are equal, round, and reactive to light.  Cardiovascular:     Rate and Rhythm: Normal rate and regular rhythm.     Pulses: Normal pulses.     Heart sounds: Normal heart sounds.  Pulmonary:     Effort: Pulmonary effort is normal.     Breath sounds: Normal breath sounds.  Abdominal:     General: Abdomen is flat. Bowel sounds are normal.     Tenderness: There is no abdominal tenderness. There is no guarding.  Musculoskeletal:        General: No tenderness or deformity. Normal range of motion.     Cervical back: Normal range of motion and neck supple.     Thoracic back: Normal.     Lumbar back: Normal.     Right hip: Normal.     Left hip: Normal.     Right knee: Normal.     Left knee: Normal.     Comments: 5/5 BLE strength 2+ DTRs BUE and BLE, no muscle wasting.  Intact sensation of BLE  Skin:    General: Skin is warm and dry.     Capillary Refill: Capillary refill takes less than 2 seconds.  Neurological:     General: No focal deficit present.     Mental Status: He is alert and oriented to person, place, and time.     Deep Tendon Reflexes: Reflexes normal.  Psychiatric:        Mood and Affect: Mood normal.        Behavior: Behavior normal.     ED Results / Procedures / Treatments   Labs (all labs ordered are listed, but only abnormal results are displayed) Labs Reviewed - No data to display  EKG None  Radiology DG Lumbar Spine Complete  Result Date: 12/15/2019 CLINICAL DATA:  Chronic left back pain history of remote military injury EXAM: LUMBAR SPINE - COMPLETE 4+ VIEW COMPARISON:   None. FINDINGS: There are 5 lumbar type vertebral bodies, lowest disc space denoted as L5-S1. There is some anterior deformity of the S1 endplate with ill-defined superimposed discogenic changes which may correspond with site of reported injury. Grade 1 anterolisthesis L4 on L5. Retrolisthesis L5 on S1. Suspect bilateral L5 pars defects. L4 pars defects are less convincing. Additional minimal discogenic changes in the lower thoracic spine and thoracic lumbar junction. Facet degenerative changes are maximal L4-S1. Included portions of the bony pelvis appear grossly intact. Soft tissues are unremarkable.  IMPRESSION: 1. Irregularity of the anterosuperior corner of the S1 endplate with superimposed discogenic changes which may correspond with site of reported remote injury. 2. Additional degenerative changes the spine as detailed above. 3. Mild retrolisthesis L5 on S1 with fossa bilateral L5 pars defects. 4. Grade 1 anterolisthesis L4 on L5 without convincing spondylolysis. Electronically Signed   By: Kreg Shropshire M.D.   On: 12/15/2019 23:57    Procedures Procedures (including critical care time)  Medications Ordered in ED Medications  ketorolac (TORADOL) 60 MG/2ML injection (has no administration in time range)  lidocaine (LIDODERM) 5 % (has no administration in time range)    ED Course  I have reviewed the triage vital signs and the nursing notes.  Pertinent labs & imaging results that were available during my care of the patient were reviewed by me and considered in my medical decision making (see chart for details).    Patient seen just prior to down time. Toradol ordered.  Xray changes are chronic. There is no weakness, no point tenderness. EDP explained we do not have MRI or pain management in the ED but we would refer the patient to spine specialists for him to establish care as he reports he was getting MRIs 4 times a year. The patient initially seemed reasonable and amenable to this plan.    Pain medication was ordered and given immediately.    Symptoms are not worse and are chronic in nature.  Patient denies any acute injury.  Patient walked into the department and drove self to the ED.  Patient has not attempted pain medication or any kind for his pain.  Patient is not weak nor numb in his lower extremities.  I dot not believe the patient has an acute cord issue.  There is no IVDA not instrumentation to suggest epidural abscess.  Moreover, there is weakness nor numbness and patient was ambulating and working.  I do not believe the patient has cauda equina.  Nor to I believe patient needs emergent transfer for MRI.    Upon discharge, patient and he reports no change in chronic pain with "shot of ibuprofen, needs something stronger for my pain" and this is "bullshit" nothing was done for the patient and nothing was given to get rid of his pain.  EDP explained that we were the goal was always to given him some relief and then to get him to specialists that could help his chronic pain.  It is not a reasonable expectation to be made pain free after many years of pain.  Patient had not taken anything nor had he done anything to follow up for his pain.  EDP explained we had done imaging and referred patient to spine doctors but patient is angry and swearing.  The patient was offered steroids but declined this.  This is drug seeking behavior.  Patient refused RX ordered for lidoderm and voltaren xr.    Final Clinical Impression(s) / ED Diagnoses  Return for intractable cough, coughing up blood,fevers >100.4 unrelieved by medication, shortness of breath, intractable vomiting, chest pain, shortness of breath, weakness,numbness, changes in speech, facial asymmetry,abdominal pain, passing out,Inability to tolerate liquids or food, cough, altered mental status or any concerns. No signs of systemic illness or infection. The patient is nontoxic-appearing on exam and vital signs are within normal  limits.   I have reviewed the triage vital signs and the nursing notes. Pertinent labs &imaging results that were available during my care of the patient were reviewed by me  and considered in my medical decision making (see chart for details).After history, exam, and medical workup I feel the patient has beenappropriately medically screened and is safe for discharge home. Pertinent diagnoses were discussed with the patient. Patient was given return precautions.        Scotti Motter, MD 12/16/19 [phone]

## 2019-12-25 ENCOUNTER — Other Ambulatory Visit: Payer: Self-pay

## 2019-12-25 ENCOUNTER — Emergency Department (HOSPITAL_BASED_OUTPATIENT_CLINIC_OR_DEPARTMENT_OTHER)
Admission: EM | Admit: 2019-12-25 | Discharge: 2019-12-26 | Disposition: A | Discharge status: Home / Self Care | Payer: BC Managed Care – PPO | Attending: Emergency Medicine | Admitting: Emergency Medicine

## 2019-12-25 ENCOUNTER — Encounter (HOSPITAL_BASED_OUTPATIENT_CLINIC_OR_DEPARTMENT_OTHER): Payer: Self-pay | Admitting: Emergency Medicine

## 2019-12-25 DIAGNOSIS — F10239 Alcohol dependence with withdrawal, unspecified: Secondary | ICD-10-CM | POA: Insufficient documentation

## 2019-12-25 DIAGNOSIS — Y907 Blood alcohol level of 200-239 mg/100 ml: Secondary | ICD-10-CM | POA: Diagnosis not present

## 2019-12-25 DIAGNOSIS — R6889 Other general symptoms and signs: Secondary | ICD-10-CM

## 2019-12-25 DIAGNOSIS — F17210 Nicotine dependence, cigarettes, uncomplicated: Secondary | ICD-10-CM | POA: Diagnosis not present

## 2019-12-25 LAB — COMPREHENSIVE METABOLIC PANEL
ALT: 119 U/L — ABNORMAL HIGH (ref 0–44)
ALT: 106 U/L — ABNORMAL HIGH (ref 0–44)
AST: 129 U/L — ABNORMAL HIGH (ref 15–41)
AST: 109 U/L — ABNORMAL HIGH (ref 15–41)
Albumin: 4.1 g/dL (ref 3.5–5.0)
Albumin: 3.8 g/dL (ref 3.5–5.0)
Alkaline Phosphatase: 69 U/L (ref 38–126)
Alkaline Phosphatase: 69 U/L (ref 38–126)
Anion gap: 17 — ABNORMAL HIGH (ref 5–15)
Anion gap: 15 (ref 5–15)
BUN: 17 mg/dL (ref 6–20)
BUN: 16 mg/dL (ref 6–20)
CO2: 19 mmol/L — ABNORMAL LOW (ref 22–32)
CO2: 19 mmol/L — ABNORMAL LOW (ref 22–32)
Calcium: 7.9 mg/dL — ABNORMAL LOW (ref 8.9–10.3)
Calcium: 7.5 mg/dL — ABNORMAL LOW (ref 8.9–10.3)
Chloride: 96 mmol/L — ABNORMAL LOW (ref 98–111)
Chloride: 99 mmol/L (ref 98–111)
Creatinine, Ser: 0.82 mg/dL (ref 0.61–1.24)
Creatinine, Ser: 0.9 mg/dL (ref 0.61–1.24)
GFR calc Af Amer: >60 mL/min (ref >60)
GFR calc Af Amer: >60 mL/min (ref >60)
GFR calc non Af Amer: >60 mL/min (ref >60)
GFR calc non Af Amer: >60 mL/min (ref >60)
Glucose, Bld: 95 mg/dL (ref 70–99)
Glucose, Bld: 83 mg/dL (ref 70–99)
Potassium: 3.7 mmol/L (ref 3.5–5.1)
Potassium: 3.9 mmol/L (ref 3.5–5.1)
Sodium: 132 mmol/L — ABNORMAL LOW (ref 135–145)
Sodium: 133 mmol/L — ABNORMAL LOW (ref 135–145)
Total Bilirubin: 1.4 mg/dL — ABNORMAL HIGH (ref 0.3–1.2)
Total Bilirubin: 1.5 mg/dL — ABNORMAL HIGH (ref 0.3–1.2)
Total Protein: 7.1 g/dL (ref 6.5–8.1)
Total Protein: 6.4 g/dL — ABNORMAL LOW (ref 6.5–8.1)

## 2019-12-25 LAB — CBC WITH DIFFERENTIAL/PLATELET
Abs Immature Granulocytes: 0.07 10*3/uL (ref 0.00–0.07)
Basophils Absolute: 0.2 10*3/uL — ABNORMAL HIGH (ref 0.0–0.1)
Basophils Relative: 1 %
Eosinophils Absolute: 0.1 10*3/uL (ref 0.0–0.5)
Eosinophils Relative: 0 %
HCT: 45.6 % (ref 39.0–52.0)
Hemoglobin: 15.7 g/dL (ref 13.0–17.0)
Immature Granulocytes: 1 %
Lymphocytes Relative: 20 %
Lymphs Abs: 2.9 10*3/uL (ref 0.7–4.0)
MCH: 30 pg (ref 26.0–34.0)
MCHC: 34.4 g/dL (ref 30.0–36.0)
MCV: 87 fL (ref 80.0–100.0)
Monocytes Absolute: 0.7 10*3/uL (ref 0.1–1.0)
Monocytes Relative: 5 %
Neutro Abs: 10.8 10*3/uL — ABNORMAL HIGH (ref 1.7–7.7)
Neutrophils Relative %: 73 %
Platelets: 333 10*3/uL (ref 150–400)
RBC: 5.24 MIL/uL (ref 4.22–5.81)
RDW: 12.1 % (ref 11.5–15.5)
WBC: 14.6 10*3/uL — ABNORMAL HIGH (ref 4.0–10.5)
nRBC: 0 % (ref 0.0–0.2)

## 2019-12-25 LAB — ETHANOL: Alcohol, Ethyl (B): 233 mg/dL — ABNORMAL HIGH (ref <10)

## 2019-12-25 MED ORDER — CHLORDIAZEPOXIDE HCL 25 MG PO CAPS
50.0000 mg | ORAL_CAPSULE | Freq: Once | ORAL | Status: AC
  Administered 2019-12-25: 50 mg via ORAL
  Filled 2019-12-25: qty 2

## 2019-12-25 MED ORDER — SODIUM CHLORIDE 0.9 % IV BOLUS
2000.0000 mL | Freq: Once | INTRAVENOUS | Status: AC
  Administered 2019-12-25: 2000 mL via INTRAVENOUS

## 2019-12-25 MED ORDER — HYDROXYZINE HCL 25 MG PO TABS
25.0000 mg | ORAL_TABLET | Freq: Once | ORAL | Status: AC
  Administered 2019-12-25: 25 mg via ORAL
  Filled 2019-12-25: qty 1

## 2019-12-25 MED ORDER — LORAZEPAM 2 MG/ML IJ SOLN
1.0000 mg | Freq: Once | INTRAMUSCULAR | Status: AC
  Administered 2019-12-25: 1 mg via INTRAVENOUS
  Filled 2019-12-25: qty 1

## 2019-12-25 NOTE — ED Triage Notes (Signed)
Pt c/o alcohol withdrawals. Last drink yesterday.

## 2019-12-25 NOTE — ED Provider Notes (Signed)
[facility] Provider Note   CSN: [id number] Arrival date & time: 12/25/19  1800     History Chief Complaint  Patient presents with  . Withdrawal    [facility] is a 39 y.o. male.  HPI   Pt is a 39 y/o male with a h/o ETOH abuse, anxiety, chronic back pain, who presents to the ED today for eval of ETOH withdrawal. States his last drink was yesterday morning. States he was "on a bender" for the last several days before this.  He started drinking 5 days ago. He is c/o palpitations, shakiness, diaphoresis, shakiness, and visual hallucinations of bugs.  Denies headache, tactile disturbances. He had an episode of vomiting earlier today.  States his drinking is sporadic and he can go months without drinking.   Past Medical History:  Diagnosis Date  . Alcohol abuse   . Anxiety   . Chronic back pain     Patient Active Problem List   Diagnosis Date Noted  . 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 Hyperbilirubinemia 06/25/2019  . Obesity (BMI 30-39.9) 06/25/2019  . Leukocytosis 06/25/2019  . Sinus tachycardia 06/25/2019  . Hyperglycemia 06/25/2019  . Alcohol withdrawal ([place]) 06/24/2019    History reviewed. No pertinent surgical history.     No family history on file.  Social History   Tobacco Use  . Smoking status: Current Some Day Smoker    Types: Cigarettes  . Smokeless tobacco: Never Used  Vaping Use  . Vaping Use: Never used  Substance Use Topics  . Alcohol use: Yes    Comment: occ  . Drug use: Never    Home Medications Prior to Admission medications   Medication Sig Start Date End Date Taking? Authorizing Provider  acetaminophen (TYLENOL) 500 MG tablet Take 500 mg by mouth every 6 (six) hours as needed for mild pain or headache.    [provider]  carbamazepine (TEGRETOL) 200 MG tablet 800mg  PO QD X 1D, then 600mg  PO QD X 1D, then 400mg  QD X 1D, then 200mg  PO QD X 2D 12/26/19    Amaiya Scruton S, PA-C  chlordiazePOXIDE (LIBRIUM) 25 MG capsule 50mg  PO TID x 1D, then 25-50mg  PO BID X 1D, then 25-50mg  PO QD X 1D 11/29/19   Providence Lanius A, PA-C  folic acid (FOLVITE) 1 MG tablet Take 1 tablet (1 mg total) by mouth daily. Patient not taking: Reported on 11/05/2019 06/27/19   Shelly Coss, MD  thiamine 100 MG tablet Take 1 tablet (100 mg total) by mouth daily. Patient not taking: Reported on 11/05/2019 06/27/19   Shelly Coss, MD    Allergies    Patient has no known allergies.  Review of Systems   Review of Systems  Constitutional: Negative for fever.  HENT: Negative for ear pain and sore throat.   Eyes: Negative for visual disturbance.  Respiratory: Negative for cough and shortness of breath.   Cardiovascular: Positive for palpitations. Negative for chest pain.  Gastrointestinal: Positive for nausea and vomiting. Negative for abdominal pain, constipation and diarrhea.  Genitourinary: Negative for dysuria and hematuria.  Musculoskeletal: Negative for back pain.  Skin: Negative for rash.  Neurological: Negative for seizures and syncope.  Psychiatric/Behavioral: Positive for hallucinations. The patient is nervous/anxious.   All other systems reviewed and are negative.   Physical Exam Updated Vital Signs BP (!) 153/108 (BP Location: Left Arm)   Pulse (!) 103   Temp 98.7 F (37.1 C) (Oral)   Resp 18  Ht 5\' 9"  (1.753 m)   Wt 95.3 kg   SpO2 100%   BMI 31.01 kg/m   Physical Exam Vitals and nursing note reviewed.  Constitutional:      Appearance: He is well-developed.     Comments: No tremors noted, no diaphresis  HENT:     Head: Normocephalic and atraumatic.  Eyes:     Conjunctiva/sclera: Conjunctivae normal.  Cardiovascular:     Rate and Rhythm: Regular rhythm. Tachycardia present.     Heart sounds: No murmur heard.   Pulmonary:     Effort: Pulmonary effort is normal. No respiratory distress.     Breath sounds: Normal breath sounds. No  wheezing, rhonchi or rales.  Abdominal:     General: Bowel sounds are normal.     Palpations: Abdomen is soft.     Tenderness: There is no abdominal tenderness.  Musculoskeletal:     Cervical back: Neck supple.  Skin:    General: Skin is warm and dry.  Neurological:     Mental Status: He is alert and oriented to person, place, and time.     Comments: Moving all extremities     ED Results / Procedures / Treatments   Labs (all labs ordered are listed, but only abnormal results are displayed) Labs Reviewed  ETHANOL - Abnormal; Notable for the following components:      Result Value   Alcohol, Ethyl (B) 233 (*)    All other components within normal limits  CBC WITH DIFFERENTIAL/PLATELET - Abnormal; Notable for the following components:   WBC 14.6 (*)    Neutro Abs 10.8 (*)    Basophils Absolute 0.2 (*)    All other components within normal limits  COMPREHENSIVE METABOLIC PANEL - Abnormal; Notable for the following components:   Sodium 132 (*)    Chloride 96 (*)    CO2 19 (*)    Calcium 7.9 (*)    AST 129 (*)    ALT 119 (*)    Total Bilirubin 1.4 (*)    Anion gap 17 (*)    All other components within normal limits  COMPREHENSIVE METABOLIC PANEL - Abnormal; Notable for the following components:   Sodium 133 (*)    CO2 19 (*)    Calcium 7.5 (*)    Total Protein 6.4 (*)    AST 109 (*)    ALT 106 (*)    Total Bilirubin 1.5 (*)    All other components within normal limits    EKG None  Radiology No results found.  Procedures Procedures (including critical care time)  Medications Ordered in ED Medications  chlordiazePOXIDE (LIBRIUM) capsule 50 mg (50 mg Oral Given 12/25/19 1928)  sodium chloride 0.9 % bolus 2,000 mL (0 mLs Intravenous Stopped 12/25/19 2253)  LORazepam (ATIVAN) injection 1 mg (1 mg Intravenous Given 12/25/19 2029)  hydrOXYzine (ATARAX/VISTARIL) tablet 25 mg (25 mg Oral Given 12/25/19 2113)    ED Course  I have reviewed the triage vital signs and the  nursing notes.  Pertinent labs & imaging results that were available during my care of the patient were reviewed by me and considered in my medical decision making (see chart for details).  Clinical Course as of Dec 26 3  Sat Dec 25, 2019  1946 EKG showing normal sinus rhythm nonspecific inferior T waves no acute ST-T's.   [MB]    Clinical Course User Index [MB] Dec 27, 2019, MD   MDM Rules/Calculators/A&P  Pt here c/o withdrawal sxs. Last drink yesterday AM after multiple day "bender"  Initial CIWA score is 6 by my evaluation.   Nursing completed CIWA score and noted it to be 12  CBC 14k otherwise reassuring CMP with initially low bicarb at 19 and elevated anion gap.  Liver enzymes are elevated, likely secondary to EtOH use.  He does not have abdominal pain. ETOH elevated above 200, this makes complicated alcohol withdrawal much less likely.  Patient given IV fluids and CMP was repeated which showed closure of the anion gap.   Pt given librium and on reassessment, he still complaining of some anxiety.  His CIWA is improved to 8.  He was given Ativan for anxiety as well as hydroxyzine.  On my reassessment after this he is well-appearing in no acute distress.  He is not appear to have any evidence of complicated alcohol withdraw and I feel that he is appropriate for discharge.  Will give Tegretol taper as he states he would like to stop drinking.  I also advised him to have his labs rechecked in 1 week due to elevated liver enzymes.  Advised to months with return precautions.  He voiced understanding plan reasons return to all questions answered.  Patient stable for discharge.  Final Clinical Impression(s) / ED Diagnoses Final diagnoses:  Withdrawal complaint    Rx / DC Orders ED Discharge Orders         Ordered    carbamazepine (TEGRETOL) 200 MG tablet     Discontinue  Reprint     12/26/19 0002           Nakisha Chai S, PA-C 12/26/19  0005    Terrilee Files, MD 12/26/19 1114

## 2019-12-25 NOTE — ED Notes (Addendum)
Pt educated on importance of staying in bed due to medications and safety; pt educated on using the call bell for assistance

## 2019-12-26 ENCOUNTER — Emergency Department (HOSPITAL_BASED_OUTPATIENT_CLINIC_OR_DEPARTMENT_OTHER)
Admission: EM | Admit: 2019-12-26 | Discharge: 2019-12-26 | Disposition: A | Discharge status: Home / Self Care | Payer: BC Managed Care – PPO | Source: Home / Self Care | Attending: Emergency Medicine | Admitting: Emergency Medicine

## 2019-12-26 ENCOUNTER — Other Ambulatory Visit: Payer: Self-pay

## 2019-12-26 ENCOUNTER — Encounter (HOSPITAL_BASED_OUTPATIENT_CLINIC_OR_DEPARTMENT_OTHER): Payer: Self-pay | Admitting: Emergency Medicine

## 2019-12-26 DIAGNOSIS — Z765 Malingerer [conscious simulation]: Secondary | ICD-10-CM

## 2019-12-26 DIAGNOSIS — R509 Fever, unspecified: Secondary | ICD-10-CM | POA: Insufficient documentation

## 2019-12-26 DIAGNOSIS — F1010 Alcohol abuse, uncomplicated: Secondary | ICD-10-CM

## 2019-12-26 DIAGNOSIS — F17210 Nicotine dependence, cigarettes, uncomplicated: Secondary | ICD-10-CM | POA: Insufficient documentation

## 2019-12-26 MED ORDER — CARBAMAZEPINE 200 MG PO TABS
800.0000 mg | ORAL_TABLET | Freq: Once | ORAL | Status: AC
  Administered 2019-12-26: 800 mg via ORAL
  Filled 2019-12-26: qty 4

## 2019-12-26 MED ORDER — CARBAMAZEPINE 200 MG PO TABS: ORAL_TABLET | ORAL | 0 refills | Status: AC

## 2019-12-26 NOTE — ED Triage Notes (Signed)
Pt requesting librium RX - was seen in our ED earlier for hallucinations that he attributes to alcohol withdrawal and discharged with tegretol RX. Returns because he was not happy with this RX and requests librium

## 2019-12-26 NOTE — ED Provider Notes (Signed)
[facility] EMERGENCY DEPARTMENT Provider Note   CSN: [id number] Arrival date & time: 12/26/19  4098     History Chief Complaint  Patient presents with  . wants librum    Leonard Atkinson is a 39 y.o. male.  The history is provided by the patient.  Patient with chronic pain, substance abuse and drug seeking seen and discharged this evening for alcohol intoxication presents because he wants librium and did not get it from the previous team.  He was prescribed tegretol and this was sent to the pharmacy but he drove back over because he wants benzodiazepines.  No tremors no seizures.  No vomiting.  No AH no VH     Past Medical History:  Diagnosis Date  . Alcohol abuse   . Anxiety   . Chronic back pain     Patient Active Problem List   Diagnosis Date Noted  . 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 Hyperbilirubinemia 06/25/2019  . Obesity (BMI 30-39.9) 06/25/2019  . Leukocytosis 06/25/2019  . Sinus tachycardia 06/25/2019  . Hyperglycemia 06/25/2019  . Alcohol withdrawal (HCC) 06/24/2019    History reviewed. No pertinent surgical history.     No family history on file.  Social History   Tobacco Use  . Smoking status: Current Some Day Smoker    Types: Cigarettes  . Smokeless tobacco: Never Used  Vaping Use  . Vaping Use: Never used  Substance Use Topics  . Alcohol use: Yes  . Drug use: Never    Home Medications Prior to Admission medications   Medication Sig Start Date End Date Taking? Authorizing Provider  acetaminophen (TYLENOL) 500 MG tablet Take 500 mg by mouth every 6 (six) hours as needed for mild pain or headache.    [provider]  carbamazepine (TEGRETOL) 200 MG tablet 800mg  PO QD X 1D, then 600mg  PO QD X 1D, then 400mg  QD X 1D, then 200mg  PO QD X 2D 12/26/19   Couture, Cortni S, PA-C  chlordiazePOXIDE (LIBRIUM) 25 MG capsule 50mg  PO TID x 1D, then 25-50mg  PO BID X 1D, then 25-50mg  PO QD X 1D 11/29/19    A, PA-C  folic acid (FOLVITE) 1 MG tablet Take 1 tablet (1 mg total) by mouth daily. Patient not taking: Reported on 11/05/2019 06/27/19   , MD  thiamine 100 MG tablet Take 1 tablet (100 mg total) by mouth daily. Patient not taking: Reported on 11/05/2019 06/27/19   11/07/2019, MD    Allergies    Patient has no known allergies.  Review of Systems   Review of Systems  Constitutional: Negative for fever.  HENT: Negative for congestion.   Eyes: Negative for visual disturbance.  Respiratory: Negative for shortness of breath.   Cardiovascular: Negative for chest pain.  Gastrointestinal: Negative for abdominal pain.  Genitourinary: Negative for difficulty urinating.  Skin: Negative for wound.  Neurological: Negative for dizziness.  Psychiatric/Behavioral: Negative for agitation.  All other systems reviewed and are negative.   Physical Exam Updated Vital Signs BP (!) 149/104 (BP Location: Left Arm)   Pulse (!) 129   Temp 98.9 F (37.2 C)   Resp 18   Ht 5\' 9"  (1.753 m)   Wt 95.3 kg   SpO2 98%   BMI 31.01 kg/m   Physical Exam Vitals and nursing note reviewed.  Constitutional:      General: He is not in acute distress.    Appearance: Normal appearance.  HENT:  Head: Normocephalic and atraumatic.     Nose: Nose normal.  Eyes:     Conjunctiva/sclera: Conjunctivae normal.     Pupils: Pupils are equal, round, and reactive to light.  Cardiovascular:     Rate and Rhythm: Regular rhythm. Tachycardia present.     Pulses: Normal pulses.     Heart sounds: Normal heart sounds.  Pulmonary:     Effort: Pulmonary effort is normal.     Breath sounds: Normal breath sounds.  Abdominal:     General: Abdomen is flat. Bowel sounds are normal.     Tenderness: There is no abdominal tenderness.  Musculoskeletal:        General: Normal range of motion.  Skin:    General: Skin is warm and dry.     Capillary Refill: Capillary refill takes less than 2  seconds.  Neurological:     General: No focal deficit present.     Mental Status: He is alert and oriented to person, place, and time.     Deep Tendon Reflexes: Reflexes normal.  Psychiatric:        Mood and Affect: Mood normal.        Behavior: Behavior normal.     ED Results / Procedures / Treatments   Labs (all labs ordered are listed, but only abnormal results are displayed) Labs Reviewed - No data to display  EKG None  Radiology No results found.  Procedures Procedures (including critical care time)  Medications Ordered in ED Medications  carbamazepine (TEGRETOL) tablet 800 mg (has no administration in time range)    ED Course  I have reviewed the triage vital signs and the nursing notes.  Pertinent labs & imaging results that were available during my care of the patient were reviewed by me and considered in my medical decision making (see chart for details).    Patient has consistently been tachycardiac with fluids.  He was discharge and was intoxicated.  There are no hallucinations.  He has not stopped drinking and smells of ETOH.  I will not be prescribing a benzo to this patient.  He was hydrated in the ED.  RX for tegretol was sent but patient did not fill it as it is not the drug he wanted.  I have offered the first dose of tegretol here.  I do not believe the patient is in DTs.  I suspect he has drunk more ETOH.  I believe the patient to be drug seeking.    Leonard Atkinson was evaluated in Emergency Department on 12/26/2019 for the symptoms described in the history of present illness. He was evaluated in the context of the global COVID-19 pandemic, which necessitated consideration that the patient might be at risk for infection with the SARS-CoV-2 virus that causes COVID-19. Institutional protocols and algorithms that pertain to the evaluation of patients at risk for COVID-19 are in a state of rapid change based on information released by regulatory bodies including the  CDC and federal and state organizations. These policies and algorithms were followed during the patient's care in the ED.  Final Clinical Impression(s) / ED Diagnoses Final diagnoses:  Alcohol abuse  Drug-seeking behavior   Return for intractable cough, coughing up blood,fevers >100.4 unrelieved by medication, shortness of breath, intractable vomiting, chest pain, shortness of breath, weakness,numbness, changes in speech, facial asymmetry,abdominal pain, passing out,Inability to tolerate liquids or food, cough, altered mental status or any concerns. No signs of systemic illness or infection. The patient is nontoxic-appearing on exam and vital  signs are within normal limits.   I have reviewed the triage vital signs and the nursing notes. Pertinent labs &imaging results that were available during my care of the patient were reviewed by me and considered in my medical decision making (see chart for details).After history, exam, and medical workup I feel the patient has beenappropriately medically screened and is safe for discharge home. Pertinent diagnoses were discussed with the patient. Patient was given return precautions.    Ieasha Boerema, MD 12/26/19 1791

## 2019-12-27 ENCOUNTER — Other Ambulatory Visit: Payer: Self-pay

## 2019-12-27 ENCOUNTER — Emergency Department (HOSPITAL_BASED_OUTPATIENT_CLINIC_OR_DEPARTMENT_OTHER)
Admission: EM | Admit: 2019-12-27 | Discharge: 2019-12-27 | Disposition: A | Discharge status: Home / Self Care | Payer: BC Managed Care – PPO | Attending: Emergency Medicine | Admitting: Emergency Medicine

## 2019-12-27 ENCOUNTER — Encounter (HOSPITAL_BASED_OUTPATIENT_CLINIC_OR_DEPARTMENT_OTHER): Payer: Self-pay | Admitting: *Deleted

## 2019-12-27 DIAGNOSIS — H73893 Other specified disorders of tympanic membrane, bilateral: Secondary | ICD-10-CM | POA: Insufficient documentation

## 2019-12-27 DIAGNOSIS — J029 Acute pharyngitis, unspecified: Secondary | ICD-10-CM | POA: Diagnosis not present

## 2019-12-27 DIAGNOSIS — H6593 Unspecified nonsuppurative otitis media, bilateral: Secondary | ICD-10-CM

## 2019-12-27 DIAGNOSIS — F17210 Nicotine dependence, cigarettes, uncomplicated: Secondary | ICD-10-CM | POA: Insufficient documentation

## 2019-12-27 DIAGNOSIS — H9209 Otalgia, unspecified ear: Secondary | ICD-10-CM | POA: Diagnosis present

## 2019-12-27 HISTORY — DX: Malingerer (conscious simulation): Z76.5

## 2019-12-27 MED ORDER — LORATADINE 10 MG PO TABS: 10.0000 mg | ORAL_TABLET | Freq: Every day | ORAL | 0 refills | Status: AC

## 2019-12-27 MED ORDER — MECLIZINE HCL 12.5 MG PO TABS
12.5000 mg | ORAL_TABLET | Freq: Two times a day (BID) | ORAL | 0 refills | Status: AC | PRN
Start: 2019-12-27 — End: ?

## 2019-12-27 NOTE — Discharge Instructions (Signed)
Start taking loratadine for treatment of muffled hearing and fullness.  You could also take meclizine up to twice daily as needed for dizziness.  Drink plenty of fluids and get rest Follow-up with primary care provider for reevaluation of symptoms. Return to the emergency department if any concerning signs or symptoms develop.

## 2019-12-27 NOTE — ED Triage Notes (Signed)
Pt c/o left ear pain x 4 days 

## 2019-12-27 NOTE — ED Provider Notes (Signed)
[name] EMERGENCY DEPARTMENT Provider Note   CSN: [id number] Arrival date & time: 12/27/19  1953     History Chief Complaint  Patient presents with  . Otalgia    Leonard Atkinson is a 39 y.o. male with history of alcohol abuse, chronic back pain, obesity presents for evaluation of acute onset, persistent bilateral ear fullness for 4 days.  Reports symptoms are worse on the left but occur bilaterally.  He feels as though his hearing is muffled.  Denies ear drainage.  He states he has a history of recurrent otitis media as a child and required tympanostomy tube placement but states this does not feel as bad as when he had ear infections.  Denies fevers.  He states that since his symptoms began he has felt as though he was having some unsteadiness with ambulation.  He denies any alcohol or drug use "not in a while", however he was seen 2 days in a row for alcohol withdrawal-type symptoms.  He has not tried anything for his symptoms today.  The history is provided by the patient.       Past Medical History:  Diagnosis Date  . Alcohol abuse   . Anxiety   . Chronic back pain   . Drug-seeking behavior     Patient Active Problem List   Diagnosis Date Noted  . Alcohol withdrawal syndrome with perceptual disturbance ([place]) 06/25/2019  . Hyperbilirubinemia 06/25/2019  . Obesity (BMI 30-39.9) 06/25/2019  . Leukocytosis 06/25/2019  . Sinus tachycardia 06/25/2019  . Hyperglycemia 06/25/2019  . Alcohol withdrawal ([place]) 06/24/2019    History reviewed. No pertinent surgical history.     History reviewed. No pertinent family history.  Social History   Tobacco Use  . Smoking status: Current Some Day Smoker    Types: Cigarettes  . Smokeless tobacco: Never Used  Vaping Use  . Vaping Use: Never used  Substance Use Topics  . Alcohol use: Yes  . Drug use: Never    Home Medications Prior to Admission medications   Medication Sig Start Date End Date Taking? Authorizing  Provider  acetaminophen (TYLENOL) 500 MG tablet Take 500 mg by mouth every 6 (six) hours as needed for mild pain or headache.    [provider]  carbamazepine (TEGRETOL) 200 MG tablet 800mg  PO QD X 1D, then 600mg  PO QD X 1D, then 400mg  QD X 1D, then 200mg  PO QD X 2D 12/26/19   Couture, Cortni S, PA-C  chlordiazePOXIDE (LIBRIUM) 25 MG capsule 50mg  PO TID x 1D, then 25-50mg  PO BID X 1D, then 25-50mg  PO QD X 1D 11/29/19   [place] Lanius A, PA-C  folic acid (FOLVITE) 1 MG tablet Take 1 tablet (1 mg total) by mouth daily. Patient not taking: Reported on 11/05/2019 06/27/19   Shelly Coss, MD  loratadine (CLARITIN) 10 MG tablet Take 1 tablet (10 mg total) by mouth daily. 12/27/19   Nils Flack, Tiquan Bouch A, PA-C  meclizine (ANTIVERT) 12.5 MG tablet Take 1 tablet (12.5 mg total) by mouth 2 (two) times daily as needed for dizziness. 12/27/19   Ariel Wingrove A, PA-C  thiamine 100 MG tablet Take 1 tablet (100 mg total) by mouth daily. Patient not taking: Reported on 11/05/2019 06/27/19   Shelly Coss, MD    Allergies    Patient has no known allergies.  Review of Systems   Review of Systems  Constitutional: Negative for chills and fever.  HENT: Positive for sore throat. Negative for congestion, ear discharge, ear pain, sinus pressure  and sinus pain.   Respiratory: Negative for shortness of breath.   Neurological: Negative for headaches.  All other systems reviewed and are negative.   Physical Exam Updated Vital Signs BP (!) 141/110 (BP Location: Right Arm)   Pulse 93   Temp 98.6 F (37 C)   Resp 19   Ht 5\' 9"  (1.753 m)   Wt 95.3 kg   SpO2 100%   BMI 31.01 kg/m   Physical Exam Vitals and nursing note reviewed.  Constitutional:      General: He is not in acute distress.    Appearance: He is well-developed.  HENT:     Head: Normocephalic and atraumatic.     Ears:     Comments: Bilateral middle ear effusion noted.  No erythema or bulging of the tympanic membranes.  There is a  cholesteatoma noted on the left side.  No mastoid tenderness.  No tenderness to palpation of the tragus or pinna.    Nose: Nose normal. No congestion or rhinorrhea.     Mouth/Throat:     Mouth: Mucous membranes are moist.     Pharynx: No oropharyngeal exudate or posterior oropharyngeal erythema.     Comments: Uvula is midline.  Tolerating secretions without difficulty.  No posterior oropharyngeal erythema, exudates, trismus or sublingual abnormalities. Eyes:     General:        Right eye: No discharge.        Left eye: No discharge.     Conjunctiva/sclera: Conjunctivae normal.  Neck:     Vascular: No JVD.     Trachea: No tracheal deviation.  Cardiovascular:     Rate and Rhythm: Normal rate.  Pulmonary:     Effort: Pulmonary effort is normal.  Abdominal:     General: There is no distension.  Musculoskeletal:     Cervical back: Normal range of motion and neck supple. No rigidity.  Lymphadenopathy:     Cervical: No cervical adenopathy.  Skin:    General: Skin is warm.     Findings: No erythema.  Neurological:     Mental Status: He is alert.     Comments: Speech is fluent.  No cranial nerve deficit noted.  Ambulatory with steady gait and balance.  Moves all extremities spontaneously without difficulty.  Psychiatric:        Behavior: Behavior normal.     ED Results / Procedures / Treatments   Labs (all labs ordered are listed, but only abnormal results are displayed) Labs Reviewed - No data to display  EKG None  Radiology No results found.  Procedures Procedures (including critical care time)  Medications Ordered in ED Medications - No data to display  ED Course  I have reviewed the triage vital signs and the nursing notes.  Pertinent labs & imaging results that were available during my care of the patient were reviewed by me and considered in my medical decision making (see chart for details).    MDM Rules/Calculators/A&P                          Patient  presenting for evaluation of muffled hearing bilaterally for 2 days, worse on the left.  He is afebrile, vital signs are stable.  He is nontoxic in appearance.  Physical examination shows cholesteatoma on the left side.  No concern for mastoiditis, malignant otitis externa, strep pharyngitis, deep space neck infection, or meningitis.  He reports feeling subjective disequilibrium but is ambulatory in the  ED without difficulty and neurologic examination is reassuring.  Symptoms could be secondary to middle ear effusion bilaterally.  He denies alcohol or drug use today.  Will start on Claritin and meclizine for symptomatic management.  We will avoid antibiotics at this time as there does not appear to be any clear indication for bacterial sinusitis, acute otitis media or otitis externa.  Recommend follow-up with PCP for reevaluation of symptoms.  Discussed strict ED return precautions. Patient verbalized understanding of and agreement with plan and is safe for discharge home at this time.    Final Clinical Impression(s) / ED Diagnoses Final diagnoses:  Fluid level behind tympanic membrane of both ears    Rx / DC Orders ED Discharge Orders         Ordered    loratadine (CLARITIN) 10 MG tablet  Daily     Discontinue  Reprint     12/27/19 2251    meclizine (ANTIVERT) 12.5 MG tablet  2 times daily PRN     Discontinue  Reprint     12/27/19 2251           Jeanie Sewer, PA-C 12/27/19 2316    Vanetta Mulders, MD 12/27/19 [phone]

## 2020-01-20 ENCOUNTER — Other Ambulatory Visit: Payer: Self-pay

## 2020-01-20 ENCOUNTER — Encounter (HOSPITAL_COMMUNITY): Payer: Self-pay | Admitting: Emergency Medicine

## 2020-01-20 DIAGNOSIS — Z79899 Other long term (current) drug therapy: Secondary | ICD-10-CM | POA: Insufficient documentation

## 2020-01-20 DIAGNOSIS — F17210 Nicotine dependence, cigarettes, uncomplicated: Secondary | ICD-10-CM | POA: Insufficient documentation

## 2020-01-20 DIAGNOSIS — F10239 Alcohol dependence with withdrawal, unspecified: Secondary | ICD-10-CM | POA: Diagnosis present

## 2020-01-20 DIAGNOSIS — F10232 Alcohol dependence with withdrawal with perceptual disturbance: Secondary | ICD-10-CM | POA: Insufficient documentation

## 2020-01-20 LAB — CBC
HCT: 45.6 % (ref 39.0–52.0)
Hemoglobin: 16.2 g/dL (ref 13.0–17.0)
MCH: 30.8 pg (ref 26.0–34.0)
MCHC: 35.5 g/dL (ref 30.0–36.0)
MCV: 86.7 fL (ref 80.0–100.0)
Platelets: 378 10*3/uL (ref 150–400)
RBC: 5.26 MIL/uL (ref 4.22–5.81)
RDW: 12.3 % (ref 11.5–15.5)
WBC: 15.1 10*3/uL — ABNORMAL HIGH (ref 4.0–10.5)
nRBC: 0 % (ref 0.0–0.2)

## 2020-01-20 LAB — COMPREHENSIVE METABOLIC PANEL
ALT: 71 U/L — ABNORMAL HIGH (ref 0–44)
AST: 44 U/L — ABNORMAL HIGH (ref 15–41)
Albumin: 4.4 g/dL (ref 3.5–5.0)
Alkaline Phosphatase: 68 U/L (ref 38–126)
Anion gap: 15 (ref 5–15)
BUN: 10 mg/dL (ref 6–20)
CO2: 22 mmol/L (ref 22–32)
Calcium: 8.7 mg/dL — ABNORMAL LOW (ref 8.9–10.3)
Chloride: 99 mmol/L (ref 98–111)
Creatinine, Ser: 0.86 mg/dL (ref 0.61–1.24)
GFR calc Af Amer: >60 mL/min (ref >60)
GFR calc non Af Amer: >60 mL/min (ref >60)
Glucose, Bld: 104 mg/dL — ABNORMAL HIGH (ref 70–99)
Potassium: 3.7 mmol/L (ref 3.5–5.1)
Sodium: 136 mmol/L (ref 135–145)
Total Bilirubin: 1.4 mg/dL — ABNORMAL HIGH (ref 0.3–1.2)
Total Protein: 7.7 g/dL (ref 6.5–8.1)

## 2020-01-20 LAB — ETHANOL: Alcohol, Ethyl (B): <10 mg/dL (ref <10)

## 2020-01-20 NOTE — ED Triage Notes (Addendum)
Patient reports binge drinking x4 days. States last drink x2 days ago. C/o nausea and visual hallucinations today. States sees bugs and feels them on skin.

## 2020-01-21 ENCOUNTER — Emergency Department (HOSPITAL_COMMUNITY)
Admission: EM | Admit: 2020-01-21 | Discharge: 2020-01-21 | Disposition: A | Discharge status: Home / Self Care | Payer: BC Managed Care – PPO | Attending: Emergency Medicine | Admitting: Emergency Medicine

## 2020-01-21 DIAGNOSIS — F10932 Alcohol use, unspecified with withdrawal with perceptual disturbance: Secondary | ICD-10-CM

## 2020-01-21 LAB — RAPID URINE DRUG SCREEN, HOSP PERFORMED
Amphetamines: NOT DETECTED
Barbiturates: NOT DETECTED
Benzodiazepines: NOT DETECTED
Cocaine: NOT DETECTED
Opiates: NOT DETECTED
Tetrahydrocannabinol: NOT DETECTED

## 2020-01-21 MED ORDER — CHLORDIAZEPOXIDE HCL 25 MG PO CAPS: ORAL_CAPSULE | ORAL | 0 refills | Status: AC

## 2020-01-21 MED ORDER — LORAZEPAM 2 MG/ML IJ SOLN: 0.0000 mg | Freq: Four times a day (QID) | INTRAMUSCULAR | Status: DC

## 2020-01-21 MED ORDER — CHLORDIAZEPOXIDE HCL 25 MG PO CAPS
50.0000 mg | ORAL_CAPSULE | Freq: Once | ORAL | Status: AC
  Administered 2020-01-21: 50 mg via ORAL
  Filled 2020-01-21: qty 2

## 2020-01-21 MED ORDER — LORAZEPAM 1 MG PO TABS: 0.0000 mg | ORAL_TABLET | Freq: Two times a day (BID) | ORAL | Status: DC

## 2020-01-21 MED ORDER — LORAZEPAM 2 MG/ML IJ SOLN: 0.0000 mg | Freq: Two times a day (BID) | INTRAMUSCULAR | Status: DC

## 2020-01-21 MED ORDER — LORAZEPAM 1 MG PO TABS
0.0000 mg | ORAL_TABLET | Freq: Four times a day (QID) | ORAL | Status: DC
  Administered 2020-01-21: 1 mg via ORAL
  Filled 2020-01-21: qty 1

## 2020-01-21 MED ORDER — THIAMINE HCL 100 MG/ML IJ SOLN: 100.0000 mg | Freq: Every day | INTRAMUSCULAR | Status: DC

## 2020-01-21 MED ORDER — THIAMINE HCL 100 MG PO TABS
100.0000 mg | ORAL_TABLET | Freq: Every day | ORAL | Status: DC
  Administered 2020-01-21: 100 mg via ORAL
  Filled 2020-01-21: qty 1

## 2020-01-21 NOTE — ED Provider Notes (Signed)
[facility]-EMERGENCY DEPT Provider Note   CSN: [id number] Arrival date & time: 01/20/20  2013     History Chief Complaint  Patient presents with  . Withdrawal    Leonard Atkinson is a 39 y.o. male with a past medical history of alcohol abuse who presents the emergency department for alcohol withdrawal symptoms.  Patient states that he has been on alcohol binge for the past 5 to 6 days drinking at least 1/5 of liquor daily.  He states that he stopped drinking about 2 days ago.  Since that time he has had the shakes, feelings of agitation, cold sweats.  Yesterday night when he arrived at the emergency department he was having active hallucinations of visually seeing bugs and feeling them on his skin.  He states that his symptoms have improved to some degree during his 12-hour wait to be seen.  He denies any other drug use.  He states that he is scheduled to go to rehab in [place] at the first week of August.  HPI     Past Medical History:  Diagnosis Date  . Alcohol abuse   . Anxiety   . Chronic back pain   . Drug-seeking behavior     Patient Active Problem List   Diagnosis Date Noted  . 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 Hyperbilirubinemia 06/25/2019  . Obesity (BMI 30-39.9) 06/25/2019  . Leukocytosis 06/25/2019  . Sinus tachycardia 06/25/2019  . Hyperglycemia 06/25/2019  . Alcohol withdrawal (HCC) 06/24/2019    History reviewed. No pertinent surgical history.     No family history on file.  Social History   Tobacco Use  . Smoking status: Current Some Day Smoker    Types: Cigarettes  . Smokeless tobacco: Never Used  Vaping Use  . Vaping Use: Never used  Substance Use Topics  . Alcohol use: Yes  . Drug use: Never    Home Medications Prior to Admission medications   Medication Sig Start Date End Date Taking? Authorizing Provider  lidocaine (LIDODERM) 5 % Place 1 patch onto the skin as needed (pain).  Remove & Discard patch within 12 hours or as directed by MD   Yes [provider]  carbamazepine (TEGRETOL) 200 MG tablet 800mg  PO QD X 1D, then 600mg  PO QD X 1D, then 400mg  QD X 1D, then 200mg  PO QD X 2D Patient not taking: Reported on 01/21/2020 12/26/19   Couture, Cortni S, PA-C  chlordiazePOXIDE (LIBRIUM) 25 MG capsule 50mg  PO TID x 1D, then 25-50mg  PO BID X 1D, then 25-50mg  PO QD X 1D 01/21/20   Mckynna Vanloan, PA-C  folic acid (FOLVITE) 1 MG tablet Take 1 tablet (1 mg total) by mouth daily. Patient not taking: Reported on 11/05/2019 06/27/19   12/28/19, MD  loratadine (CLARITIN) 10 MG tablet Take 1 tablet (10 mg total) by mouth daily. Patient not taking: Reported on 01/21/2020 12/27/19   11/07/2019 A, PA-C  meclizine (ANTIVERT) 12.5 MG tablet Take 1 tablet (12.5 mg total) by mouth 2 (two) times daily as needed for dizziness. Patient not taking: Reported on 01/21/2020 12/27/19   01/23/2020 A, PA-C  thiamine 100 MG tablet Take 1 tablet (100 mg total) by mouth daily. Patient not taking: Reported on 11/05/2019 06/27/19   01/23/2020, MD    Allergies    Patient has no known allergies.  Review of Systems   Review of Systems Ten systems reviewed and are negative for acute change, except as  noted in the HPI.   Physical Exam Updated Vital Signs BP (!) 140/98 (BP Location: Right Arm)   Pulse 100   Temp 98 F (36.7 C) (Oral)   Resp 18   SpO2 95%   Physical Exam Vitals and nursing note reviewed.  Constitutional:      General: He is not in acute distress.    Appearance: He is well-developed. He is not diaphoretic.  HENT:     Head: Normocephalic and atraumatic.  Eyes:     General: No scleral icterus.    Conjunctiva/sclera: Conjunctivae normal.  Cardiovascular:     Rate and Rhythm: Normal rate and regular rhythm.     Heart sounds: Normal heart sounds.  Pulmonary:     Effort: Pulmonary effort is normal. No respiratory distress.     Breath sounds: Normal breath sounds.   Abdominal:     Palpations: Abdomen is soft.     Tenderness: There is no abdominal tenderness.  Musculoskeletal:     Cervical back: Normal range of motion and neck supple.  Skin:    General: Skin is warm and dry.  Neurological:     Mental Status: He is alert.  Psychiatric:        Behavior: Behavior normal.     ED Results / Procedures / Treatments   Labs (all labs ordered are listed, but only abnormal results are displayed) Labs Reviewed  COMPREHENSIVE METABOLIC PANEL - Abnormal; Notable for the following components:      Result Value   Glucose, Bld 104 (*)    Calcium 8.7 (*)    AST 44 (*)    ALT 71 (*)    Total Bilirubin 1.4 (*)    All other components within normal limits  CBC - Abnormal; Notable for the following components:   WBC 15.1 (*)    All other components within normal limits  ETHANOL  RAPID URINE DRUG SCREEN, HOSP PERFORMED    EKG None  Radiology No results found.  Procedures Procedures (including critical care time)  Medications Ordered in ED Medications  thiamine tablet 100 mg (100 mg Oral Given 01/21/20 0808)    Or  thiamine (B-1) injection 100 mg ( Intravenous See Alternative 01/21/20 0808)  chlordiazePOXIDE (LIBRIUM) capsule 50 mg (50 mg Oral Given 01/21/20 3976)    ED Course  I have reviewed the triage vital signs and the nursing notes.  Pertinent labs & imaging results that were available during my care of the patient were reviewed by me and considered in my medical decision making (see chart for details).  Clinical Course as of Jan 20 1210  Fri Jan 21, 2020  1432 39 year old male with history of alcohol abuse here with concerns for alcohol withdrawal.  Temperature is a little bit up tachycardic hypertensive.  States he is seeing some visual hallucinations.  Clear sensorium otherwise.  Will medicate with benzos and reassess.   [MB]    Clinical Course User Index [MB] Terrilee Files, MD   MDM Rules/Calculators/A&P                           Final Clinical Impression(s) / ED Diagnoses Final diagnoses:  Alcohol withdrawal syndrome with perceptual disturbance [facility])  39 year old male who presents with alcohol withdrawal symptoms.  I personally ordered interpreted and reviewed labs which include rapid urine drug screen, ethanol level, CBC shows elevated white blood cell count of insignificant value likely acute phase reaction.  Patient's AST and ALT  along with total bili are mildly elevated.  His glucose is also slightly elevated likely acute phase reaction.  Patient initially was hypertensive and tachycardic.  He had a CIWA score of 12 secondary to subjective reports.  Patient was given a loading dose of Librium with significant improvement in his symptoms.  He will be discharged with a Librium taper.  He is advised not to drink any alcohol while he is on the taper.  He reports that he is scheduled to go to rehab at the beginning of August.  Written return precautions given to the patient at discharge.  Rx / DC Orders ED Discharge Orders         Ordered    chlordiazePOXIDE (LIBRIUM) 25 MG capsule     Discontinue  Reprint     01/21/20 1015           Arthor Captain, PA-C 01/21/20 1213    Terrilee Files, MD 01/21/20 [phone]

## 2020-01-21 NOTE — Discharge Instructions (Addendum)
Get help right away if: You have an irregular heartbeat. You have chest pain. You have trouble breathing. You have a seizure for the first time. You become very confused. You have worsening hallucinations.

## 2022-06-05 IMAGING — CR DG LUMBAR SPINE COMPLETE 4+V
5 series · 5 of 5 positions shown · non-contrast
Comparison: None.

CLINICAL DATA: Chronic left back pain history of remote military
injury

EXAM:
LUMBAR SPINE - COMPLETE 4+ VIEW

[t l-spine a.p.]
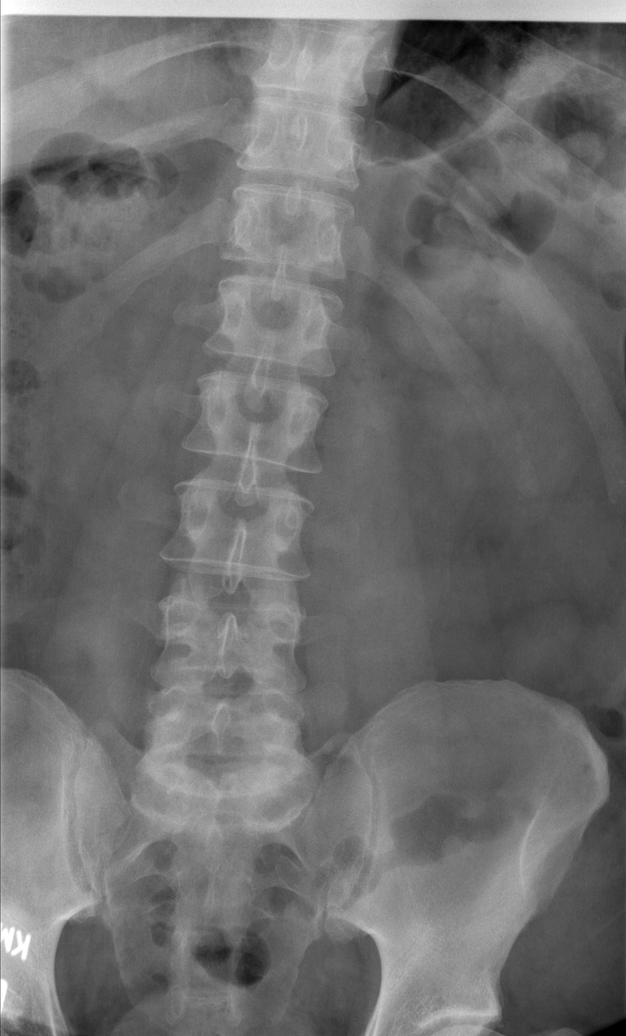

[t l-spine oblique exposure (1 of 2)]
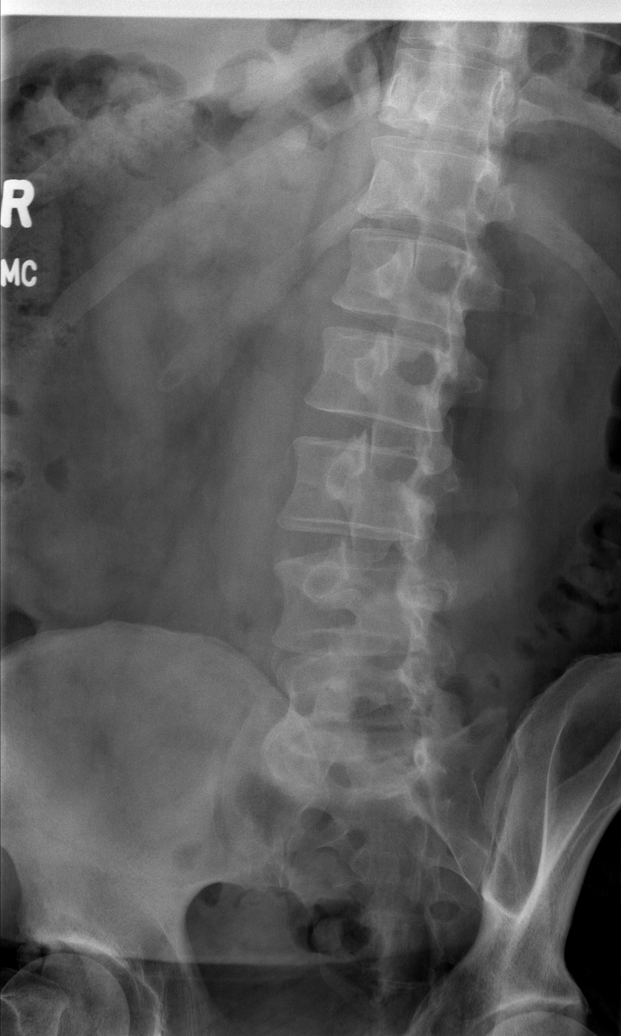

[t l-spine oblique exposure (2 of 2)]
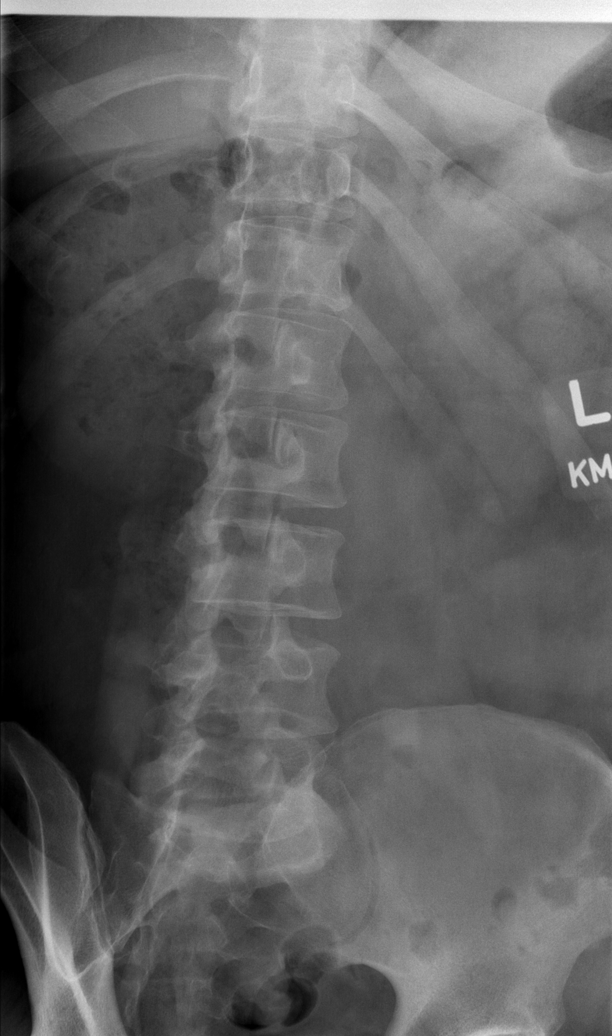

[t l-spine lat]
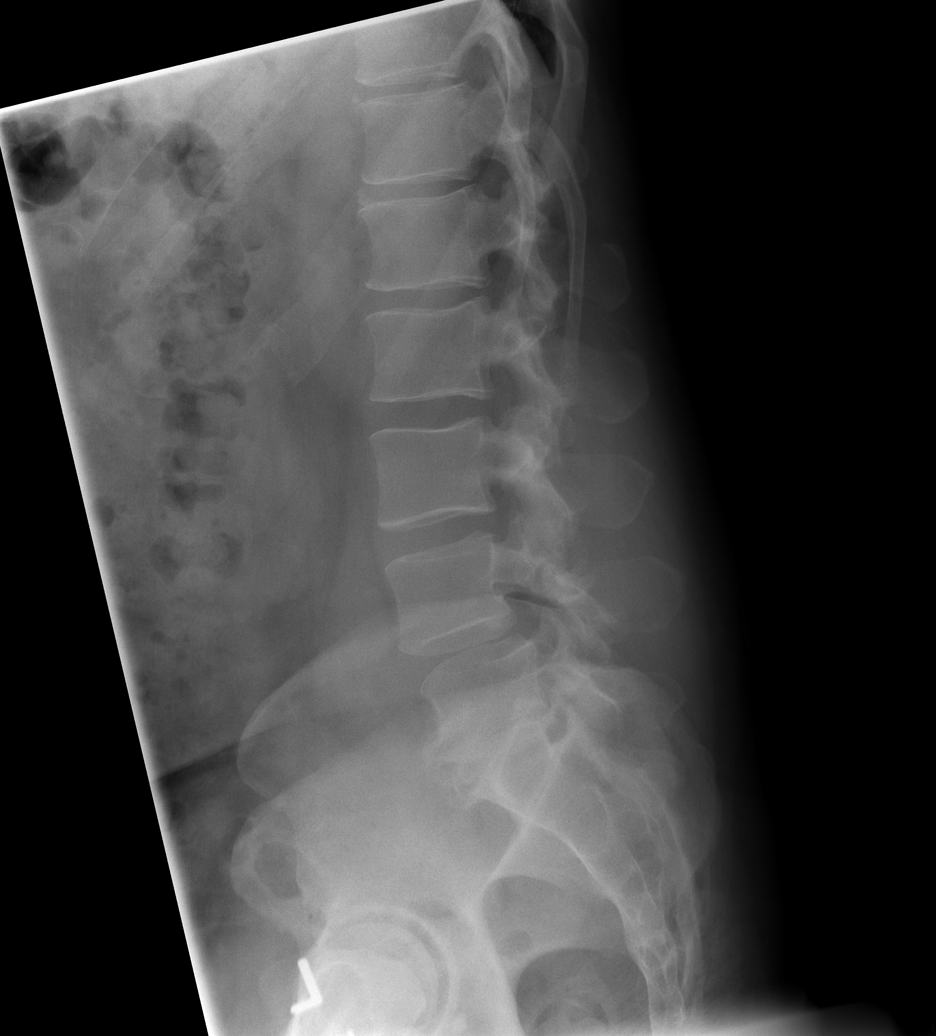

[t l-spine l5-s1 spot]
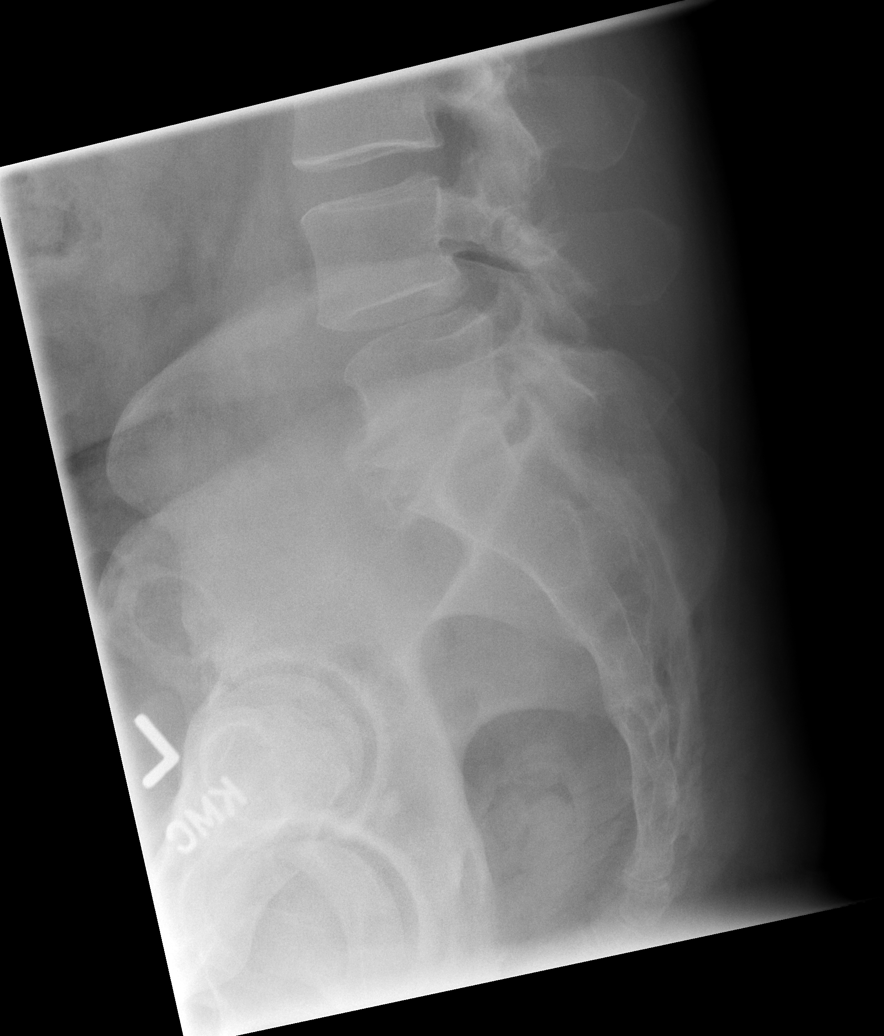

[5 of 5 positions shown; findings below may reference images not displayed]

FINDINGS: There are 5 lumbar type vertebral bodies, lowest disc space denoted
as L5-S1. There is some anterior deformity of the S1 endplate with
ill-defined superimposed discogenic changes which may correspond
with site of reported injury. Grade 1 anterolisthesis L4 on L5.
Retrolisthesis L5 on S1. Suspect bilateral L5 pars defects. L4 pars
defects are less convincing. Additional minimal discogenic changes
in the lower thoracic spine and thoracic lumbar junction. Facet
degenerative changes are maximal L4-S1. Included portions of the
bony pelvis appear grossly intact. Soft tissues are unremarkable.
IMPRESSION: 1. Irregularity of the anterosuperior corner of the S1 endplate with
superimposed discogenic changes which may correspond with site of
reported remote injury.
2. Additional degenerative changes the spine as detailed above.
3. Mild retrolisthesis L5 on S1 with fossa bilateral L5 pars
defects.
4. Grade 1 anterolisthesis L4 on L5 without convincing
spondylolysis.
